# Patient Record
Sex: Male | Born: 1960 | Race: Black or African American | Hispanic: No | Smoking: Never smoker
Health system: Southern US, Community
[De-identification: ages and names within clinical notes are randomized; demographics above are authoritative.]

## PROBLEM LIST (undated history)

## (undated) HISTORY — PX: SHOULDER ARTHROSCOPY W/ ROTATOR CUFF REPAIR: SHX2400

---

## 1998-09-29 ENCOUNTER — Emergency Department (HOSPITAL_COMMUNITY): Admission: EM | Admit: 1998-09-29 | Discharge: 1998-09-29 | Payer: Self-pay | Admitting: Emergency Medicine

## 2003-03-10 ENCOUNTER — Ambulatory Visit (HOSPITAL_BASED_OUTPATIENT_CLINIC_OR_DEPARTMENT_OTHER): Admission: RE | Admit: 2003-03-10 | Discharge: 2003-03-10 | Payer: Self-pay | Admitting: Internal Medicine

## 2003-07-11 ENCOUNTER — Encounter: Payer: Self-pay | Admitting: *Deleted

## 2003-07-11 ENCOUNTER — Emergency Department (HOSPITAL_COMMUNITY): Admission: EM | Admit: 2003-07-11 | Discharge: 2003-07-11 | Payer: Self-pay | Admitting: Emergency Medicine

## 2003-08-03 ENCOUNTER — Encounter: Admission: RE | Admit: 2003-08-03 | Discharge: 2003-08-22 | Payer: Self-pay | Admitting: *Deleted

## 2005-11-10 ENCOUNTER — Ambulatory Visit: Payer: Self-pay | Admitting: Internal Medicine

## 2005-12-03 ENCOUNTER — Ambulatory Visit (HOSPITAL_BASED_OUTPATIENT_CLINIC_OR_DEPARTMENT_OTHER): Admission: RE | Admit: 2005-12-03 | Discharge: 2005-12-03 | Payer: Self-pay | Admitting: Internal Medicine

## 2005-12-17 ENCOUNTER — Ambulatory Visit: Payer: Self-pay | Admitting: Pulmonary Disease

## 2006-01-01 ENCOUNTER — Ambulatory Visit: Payer: Self-pay | Admitting: Internal Medicine

## 2006-03-01 ENCOUNTER — Ambulatory Visit: Payer: Self-pay | Admitting: Internal Medicine

## 2006-08-02 ENCOUNTER — Ambulatory Visit: Payer: Self-pay | Admitting: Internal Medicine

## 2006-08-11 ENCOUNTER — Ambulatory Visit: Payer: Self-pay | Admitting: Cardiology

## 2006-08-25 ENCOUNTER — Ambulatory Visit: Payer: Self-pay | Admitting: Pulmonary Disease

## 2006-09-01 ENCOUNTER — Ambulatory Visit: Payer: Self-pay | Admitting: Pulmonary Disease

## 2006-09-01 ENCOUNTER — Ambulatory Visit (HOSPITAL_BASED_OUTPATIENT_CLINIC_OR_DEPARTMENT_OTHER): Admission: RE | Admit: 2006-09-01 | Discharge: 2006-09-01 | Payer: Self-pay | Admitting: Pulmonary Disease

## 2006-10-06 ENCOUNTER — Ambulatory Visit: Payer: Self-pay | Admitting: Pulmonary Disease

## 2007-03-07 ENCOUNTER — Emergency Department (HOSPITAL_COMMUNITY): Admission: EM | Admit: 2007-03-07 | Discharge: 2007-03-07 | Payer: Self-pay | Admitting: Emergency Medicine

## 2008-03-14 ENCOUNTER — Encounter: Payer: Self-pay | Admitting: Endocrinology

## 2008-06-20 ENCOUNTER — Telehealth (INDEPENDENT_AMBULATORY_CARE_PROVIDER_SITE_OTHER): Payer: Self-pay | Admitting: *Deleted
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
## 2011-05-08 NOTE — Procedures (Signed)
NAME:  [name] [name], [name] [name] NO.:  [id number]   MEDICAL RECORD NO.:  [id number]          PATIENT TYPE:  OUT   LOCATION:  SLEEP CENTER                 FACILITY[facility]   PHYSICIAN:  Marcelyn Bruins, M.D. [facility] DATE OF BIRTH:  28-Nov-1961   DATE OF STUDY:  12/03/2005                              NOCTURNAL POLYSOMNOGRAM   REFERRING PHYSICIAN:  Dr. Trinna Post Plotnikov.   DATE OF STUDY:  December 03, 2005.   INDICATIONS FOR THE STUDY:  Hypersomnia with sleep apnea.   EPWORTH SCORE:  16.   SLEEP ARCHITECTURE:  The patient had a total sleep time of 362 minutes with  decreased REM but adequate slow wave sleep. Sleep onset latency was normal  as was REM onset. Sleep efficiency was decreased at 84%.   RESPIRATORY DATA:  The patient was found to have 146 apneas and 110  hypopneas for a respiratory disturbance index of 43 events per hour. The  events were worse during REM. There was severe snoring noted.   OXYGEN DATA:  There was O2 desaturation as low as 74% associated with his  obstructive events.   CARDIAC DATA:  No clinically significant cardiac arrhythmias.   MOVEMENT/PARASOMNIA:  None.   IMPRESSION/RECOMMENDATION:  Severe obstructive sleep apnea/hypopnea syndrome  with a Respiratory Disturbance Index of 43 events per hour and O2  desaturation as low as 74%. Treatment for this degree of sleep apnea  includes weight loss as well as continuous positive airway pressure. Other  therapies can be considered but their success rate is much lower than weight  loss and continuous positive airway pressure combined.           ______________________________  Marcelyn Bruins, M.D. [facility]  Diplomate, American Board of Sleep  Medicine     KC/MEDQ  D:  12/17/2005 17:22:19  T:  12/17/2005 21:41:36  Job:  [id number]

## 2011-05-08 NOTE — Assessment & Plan Note (Signed)
[facility]                               PULMONARY OFFICE NOTE   NAME:[name] [name], [name] DIOP                        MRN:          [id number]  DATE:10/06/2006                            DOB:          Nov 25, 1961    I saw [name] [name] [name] in followup today for his severe obstructive sleep apnea.  He is currently on CPAP at 14 cm of water with nasal pillows and humidifier,  although he apparently was instructed not to use the heater on his  humidifier until the weather gets colder.  He says he tried using a full  face mask, but feels more comfortable with the nasal pillows.  He is also  using a chin strap.  He says his main difficulty is that he still gets  congestion in his sinuses with postnasal drip.  He also says that he will  occasionally get a tingling feeling in his nose from the nasal pillows, but  then as the day progresses the symptom seems to improve.  He had tried using  Veramyst nasal spray, but it did not seem that this made any difference.  He  is currently using a prescription antihistamine which helps to some degree.  His current sleep pattern is that he will go to bed between 8:30 and 9, and  wake up at about 1:30 in the morning.  He gets up out of bed at 2.  He says  that he is using his CPAP machine for the entire time that he is asleep on a  nightly basis.  He says that he has noticed that he feels better during the  day, to the point where he only needs to use his Adderall about once or  twice per week.  He is still using Adderall 20 mg, however.  He says that he  has also noticed that his headache symptoms have seemed to have improved as  well.   PHYSICAL EXAMINATION:  He is 234 pounds, temperature is 98, blood pressure  is 110/64, heart rate is 65, oxygen saturation is 95% on room air.  HEENT:  There is no sinus tenderness, no nasal discharge, no oral lesions,  no lymphadenopathy.  HEART:  With S1, S2.  CHEST:  Clear to auscultation.  ABDOMEN:  Soft, nontender, positive bowel sounds.   IMPRESSION:  1. Severe obstructive sleep apnea, currently on continuous positive airway      pressure at 14 cm of water.  I would continue him on this.  I have      encouraged him to maintain his compliance with this.  I have also      advised him that if he does in fact end up taking naps during the day,      that he would need to use his continuous positive airway pressure      machine during these naps as well.  Additionally, I have reemphasized      the importance of diet, exercise and weight reduction.  2. Insufficient sleep time.  I have again emphasized the importance of  increasing the number of hours he dedicates to sleep to improve his      overall functionality during the day, and that hopefully with this we      would be able to discontinue the use of the Adderall.  However, if he      is still having symptoms of daytime hypersomnolence in spite of      improving these measures, then consideration could be given to      switching him from Adderall to Provigil, as this has a somewhat better      safety profile.  3. Chronic sinusitis.  I will try him on Astelin 2 sprays in each nostril      b.i.d. to see if this helps with his nasal symptoms, and I have given      him a sample of this.  4. Chronic headaches.  This appears to have improved with the improvement      in his sleep quality.   PLAN:  To follow up with him in approximately 4 months.       Coralyn Helling, MD      VS/MedQ  DD:  10/06/2006  DT:  10/08/2006  Job #:  [id number]   cc:   Georgina Quint. Plotnikov, MD

## 2011-08-15 ENCOUNTER — Emergency Department (HOSPITAL_COMMUNITY)
Admission: EM | Admit: 2011-08-15 | Discharge: 2011-08-16 | Disposition: A | Discharge status: Home / Self Care | Payer: Self-pay | Attending: Emergency Medicine | Admitting: Emergency Medicine

## 2011-08-15 ENCOUNTER — Emergency Department (HOSPITAL_COMMUNITY): Payer: Self-pay

## 2011-08-15 DIAGNOSIS — J039 Acute tonsillitis, unspecified: Secondary | ICD-10-CM | POA: Insufficient documentation

## 2011-08-15 DIAGNOSIS — R1310 Dysphagia, unspecified: Secondary | ICD-10-CM | POA: Insufficient documentation

## 2011-08-15 DIAGNOSIS — R110 Nausea: Secondary | ICD-10-CM | POA: Insufficient documentation

## 2011-08-15 DIAGNOSIS — R059 Cough, unspecified: Secondary | ICD-10-CM | POA: Insufficient documentation

## 2011-08-15 DIAGNOSIS — R002 Palpitations: Secondary | ICD-10-CM | POA: Insufficient documentation

## 2011-08-15 DIAGNOSIS — G4730 Sleep apnea, unspecified: Secondary | ICD-10-CM | POA: Insufficient documentation

## 2011-08-15 DIAGNOSIS — R05 Cough: Secondary | ICD-10-CM | POA: Insufficient documentation

## 2011-08-15 DIAGNOSIS — R599 Enlarged lymph nodes, unspecified: Secondary | ICD-10-CM | POA: Insufficient documentation

## 2011-08-15 LAB — CBC
Platelets: 154 10*3/uL (ref 150–400)
RBC: 4.1 MIL/uL — ABNORMAL LOW (ref 4.22–5.81)
RDW: 13.7 % (ref 11.5–15.5)
WBC: 6 10*3/uL (ref 4.0–10.5)

## 2011-08-15 LAB — DIFFERENTIAL
Basophils Absolute: 0 10*3/uL (ref 0.0–0.1)
Eosinophils Absolute: 0 10*3/uL (ref 0.0–0.7)
Eosinophils Relative: 0 % (ref 0–5)
Lymphs Abs: 1.7 10*3/uL (ref 0.7–4.0)
Neutrophils Relative %: 65 % (ref 43–77)

## 2011-08-15 LAB — POCT I-STAT, CHEM 8
Calcium, Ion: 1.13 mmol/L (ref 1.12–1.32)
Chloride: 101 mEq/L (ref 96–112)
Glucose, Bld: 97 mg/dL (ref 70–99)
HCT: 38 % — ABNORMAL LOW (ref 39.0–52.0)
Hemoglobin: 12.9 g/dL — ABNORMAL LOW (ref 13.0–17.0)
Potassium: 4 mEq/L (ref 3.5–5.1)

## 2011-08-15 MED ORDER — IOHEXOL 300 MG/ML  SOLN
75.0000 mL | Freq: Once | INTRAMUSCULAR | Status: AC | PRN
  Administered 2011-08-15: 75 mL via INTRAVENOUS

## 2012-04-14 ENCOUNTER — Emergency Department (HOSPITAL_COMMUNITY): Payer: Self-pay

## 2012-04-14 ENCOUNTER — Emergency Department (HOSPITAL_COMMUNITY)
Admission: EM | Admit: 2012-04-14 | Discharge: 2012-04-14 | Disposition: A | Discharge status: Home / Self Care | Payer: No Typology Code available for payment source | Attending: Emergency Medicine | Admitting: Emergency Medicine

## 2012-04-14 ENCOUNTER — Encounter (HOSPITAL_COMMUNITY): Payer: Self-pay | Admitting: *Deleted

## 2012-04-14 DIAGNOSIS — IMO0002 Reserved for concepts with insufficient information to code with codable children: Secondary | ICD-10-CM | POA: Insufficient documentation

## 2012-04-14 DIAGNOSIS — M545 Low back pain, unspecified: Secondary | ICD-10-CM | POA: Insufficient documentation

## 2012-04-14 DIAGNOSIS — S46919A Strain of unspecified muscle, fascia and tendon at shoulder and upper arm level, unspecified arm, initial encounter: Secondary | ICD-10-CM

## 2012-04-14 DIAGNOSIS — S39012A Strain of muscle, fascia and tendon of lower back, initial encounter: Secondary | ICD-10-CM

## 2012-04-14 DIAGNOSIS — S0181XA Laceration without foreign body of other part of head, initial encounter: Secondary | ICD-10-CM

## 2012-04-14 DIAGNOSIS — M542 Cervicalgia: Secondary | ICD-10-CM | POA: Insufficient documentation

## 2012-04-14 DIAGNOSIS — R51 Headache: Secondary | ICD-10-CM | POA: Insufficient documentation

## 2012-04-14 DIAGNOSIS — S0180XA Unspecified open wound of other part of head, initial encounter: Secondary | ICD-10-CM | POA: Insufficient documentation

## 2012-04-14 DIAGNOSIS — M25519 Pain in unspecified shoulder: Secondary | ICD-10-CM | POA: Insufficient documentation

## 2012-04-14 MED ORDER — TETANUS-DIPHTH-ACELL PERTUSSIS 5-2.5-18.5 LF-MCG/0.5 IM SUSP
0.5000 mL | Freq: Once | INTRAMUSCULAR | Status: AC
  Administered 2012-04-14: 0.5 mL via INTRAMUSCULAR
  Filled 2012-04-14: qty 0.5

## 2012-04-14 MED ORDER — IBUPROFEN 600 MG PO TABS: 600.0000 mg | ORAL_TABLET | Freq: Four times a day (QID) | ORAL | Status: AC | PRN

## 2012-04-14 MED ORDER — ACETAMINOPHEN 325 MG PO TABS
650.0000 mg | ORAL_TABLET | Freq: Once | ORAL | Status: AC
  Administered 2012-04-14: 650 mg via ORAL
  Filled 2012-04-14: qty 2

## 2012-04-14 NOTE — ED Notes (Signed)
Patient transported to CT 
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
## 2012-04-14 NOTE — ED Notes (Signed)
Returned from radiology. C-collar removed by ED MD. NAD.

## 2012-04-14 NOTE — Discharge Instructions (Signed)
Use antibiotic cream to forehead twice daily.   RESOURCE GUIDE   No Primary Care Doctor Call [facility]  [phone] Other agencies that provide inexpensive medical care    [facility]  [phone]    [facility]  [phone]    [facility]  [phone]    [facility]  [phone]    [facility]  [phone]    [facility]  [phone]

## 2012-04-14 NOTE — ED Notes (Signed)
Patient transported to X-ray 

## 2012-04-14 NOTE — ED Notes (Signed)
Pt logrolled, LSB removed per ED MD, C-collar remains.

## 2012-04-16 NOTE — ED Provider Notes (Signed)
I saw and evaluated the patient, reviewed the resident's note and I agree with the findings and plan.   .Face to face Exam:  General:  Awake HEENT:  laceration Resp:  Normal effort Abd:  Nondistended Neuro:No focal weakness Lymph: No adenopathy   Nelia Shi, MD 04/16/12 [phone]

## 2013-06-14 ENCOUNTER — Telehealth: Payer: Self-pay

## 2013-07-05 NOTE — Telephone Encounter (Signed)
error 

## 2015-01-08 ENCOUNTER — Encounter (HOSPITAL_COMMUNITY): Payer: Self-pay | Admitting: Neurology

## 2015-01-08 ENCOUNTER — Emergency Department (HOSPITAL_COMMUNITY)
Admission: EM | Admit: 2015-01-08 | Discharge: 2015-01-08 | Disposition: A | Discharge status: Home / Self Care | Payer: Medicaid Other | Attending: Emergency Medicine | Admitting: Emergency Medicine

## 2015-01-08 ENCOUNTER — Emergency Department (HOSPITAL_COMMUNITY): Payer: Medicaid Other

## 2015-01-08 DIAGNOSIS — J0100 Acute maxillary sinusitis, unspecified: Secondary | ICD-10-CM | POA: Diagnosis not present

## 2015-01-08 DIAGNOSIS — R059 Cough, unspecified: Secondary | ICD-10-CM

## 2015-01-08 DIAGNOSIS — R05 Cough: Secondary | ICD-10-CM | POA: Diagnosis present

## 2015-01-08 DIAGNOSIS — Z79899 Other long term (current) drug therapy: Secondary | ICD-10-CM | POA: Diagnosis not present

## 2015-01-08 MED ORDER — SULFAMETHOXAZOLE-TRIMETHOPRIM 800-160 MG PO TABS: 1.0000 | ORAL_TABLET | Freq: Two times a day (BID) | ORAL | Status: DC

## 2015-01-08 MED ORDER — DIPHENHYDRAMINE HCL 25 MG PO TABS: 25.0000 mg | ORAL_TABLET | Freq: Four times a day (QID) | ORAL | Status: AC | PRN

## 2015-01-08 NOTE — ED Notes (Signed)
Pt reports cough for 2 weeks, coughing up mucus. C/o of congestion.

## 2015-01-08 NOTE — ED Notes (Signed)
Pt reports that he has been coughing for 2 weeks. Also reports nasal congestion and drainage.

## 2015-01-08 NOTE — Discharge Instructions (Signed)
Take bactrim as directed until gone. Take benadryl as needed for congestion. Refer to attached documents for more information. Continue taking Robitussin at home.

## 2015-01-08 NOTE — ED Provider Notes (Signed)
CSN: [id number]     Arrival date & time 01/08/15  4696 History  This chart was scribed for non-physician practitioner, Emilia Beck, PA-C, working with Toy Baker, MD by Charline Bills, ED Scribe. This patient was seen in room TR07C/TR07C and the patient's care was started at 10:17 AM.   Chief Complaint  Patient presents with  . Cough   The history is provided by the patient. No language interpreter was used.   HPI Comments: [name] [name] is a 54 y.o. male who presents to the Emergency Department complaining of persistent dry cough for 2 weeks, worse today. Pt reports associated nasal congestion, postnasal drip and a globulus sensation. He denies fever. Pt has been treating with Robitussin without relief.  History reviewed. No pertinent past medical history. History reviewed. No pertinent past surgical history. No family history on file. History  Substance Use Topics  . Smoking status: Never Smoker   . Smokeless tobacco: Not on file  . Alcohol Use: No    Review of Systems  HENT: Positive for congestion and postnasal drip.   Respiratory: Positive for cough.   All other systems reviewed and are negative.  Allergies  Review of patient's allergies indicates no known allergies.  Home Medications   Prior to Admission medications   Medication Sig Start Date End Date Taking? Authorizing Provider  Fexofenadine HCl (ALLEGRA PO) Take 1 tablet by mouth daily as needed. For allergies.    Historical Provider, MD   Triage Vitals: BP 126/67 mmHg  Pulse 70  Temp(Src) 97.8 F (36.6 C) (Oral)  Resp 22  SpO2 100% Physical Exam  Constitutional: He is oriented to person, place, and time. He appears well-developed and well-nourished. No distress.  HENT:  Head: Normocephalic and atraumatic.  Nose: Right sinus exhibits maxillary sinus tenderness. Left sinus exhibits maxillary sinus tenderness.  Mouth/Throat: Oropharynx is clear and moist. No oropharyngeal exudate, posterior  oropharyngeal edema or posterior oropharyngeal erythema.  Cobblestone noted of the posterior pharynx.   Eyes: Conjunctivae and EOM are normal.  Neck: Normal range of motion. Neck supple.  Cardiovascular: Normal rate, regular rhythm and normal heart sounds.   Pulmonary/Chest: Effort normal and breath sounds normal.  Abdominal: Soft. There is no tenderness. There is no rebound.  Musculoskeletal: Normal range of motion.  Neurological: He is alert and oriented to person, place, and time.  Skin: Skin is warm and dry.  Psychiatric: He has a normal mood and affect. His behavior is normal.  Nursing note and vitals reviewed.  ED Course  Procedures (including critical care time) DIAGNOSTIC STUDIES: Oxygen Saturation is 100% on RA, normal by my interpretation.    COORDINATION OF CARE: 10:19 AM-Discussed treatment plan which includes CXR, Septra DS and Benadryl with pt at bedside and pt agreed to plan.   Labs Review Labs Reviewed - No data to display  Imaging Review Dg Chest 2 View  01/08/2015   CLINICAL DATA:  Cough and short of breath.  EXAM: CHEST  2 VIEW  COMPARISON:  None.  FINDINGS: The heart size and mediastinal contours are within normal limits. Both lungs are clear. The visualized skeletal structures are unremarkable.  IMPRESSION: No active cardiopulmonary disease.   Electronically Signed   By: Marlan Palau M.D.   On: 01/08/2015 09:51    EKG Interpretation None      MDM   Final diagnoses:  Cough  Acute maxillary sinusitis, recurrence not specified    10:22 AM Chest xray unremarkable for acute changes. Patient will have  bactrim for sinusitis and benadryl for congestion. Vitals stable and patient afebrile.   I personally performed the services described in this documentation, which was scribed in my presence. The recorded information has been reviewed and is accurate.    Emilia BeckKaitlyn Jaasiel [place], PA-C 01/08/15 1023  Toy BakerAnthony T Castanon, MD 01/09/15 1005

## 2016-01-09 ENCOUNTER — Encounter: Payer: Self-pay | Admitting: Pulmonary Disease

## 2016-01-09 ENCOUNTER — Encounter: Payer: Self-pay | Admitting: Internal Medicine

## 2016-11-17 ENCOUNTER — Other Ambulatory Visit: Payer: Self-pay | Admitting: Orthopedic Surgery

## 2016-11-25 ENCOUNTER — Encounter (HOSPITAL_BASED_OUTPATIENT_CLINIC_OR_DEPARTMENT_OTHER): Payer: Self-pay

## 2016-11-25 ENCOUNTER — Ambulatory Visit (HOSPITAL_BASED_OUTPATIENT_CLINIC_OR_DEPARTMENT_OTHER): Admit: 2016-11-25 | Payer: No Typology Code available for payment source | Admitting: Orthopedic Surgery

## 2016-11-25 SURGERY — ARTHROSCOPY, SHOULDER
Anesthesia: General | Laterality: Left

## 2020-02-08 ENCOUNTER — Emergency Department (HOSPITAL_COMMUNITY): Payer: 59

## 2020-02-08 ENCOUNTER — Emergency Department (HOSPITAL_COMMUNITY)
Admission: EM | Admit: 2020-02-08 | Discharge: 2020-02-08 | Disposition: A | Discharge status: Home / Self Care | Payer: 59 | Attending: Emergency Medicine | Admitting: Emergency Medicine

## 2020-02-08 ENCOUNTER — Encounter (HOSPITAL_COMMUNITY): Payer: Self-pay

## 2020-02-08 ENCOUNTER — Other Ambulatory Visit: Payer: Self-pay

## 2020-02-08 DIAGNOSIS — Y93E9 Activity, other interior property and clothing maintenance: Secondary | ICD-10-CM | POA: Diagnosis not present

## 2020-02-08 DIAGNOSIS — S9032XA Contusion of left foot, initial encounter: Secondary | ICD-10-CM | POA: Insufficient documentation

## 2020-02-08 DIAGNOSIS — Y92009 Unspecified place in unspecified non-institutional (private) residence as the place of occurrence of the external cause: Secondary | ICD-10-CM | POA: Diagnosis not present

## 2020-02-08 DIAGNOSIS — S99922A Unspecified injury of left foot, initial encounter: Secondary | ICD-10-CM

## 2020-02-08 DIAGNOSIS — Y998 Other external cause status: Secondary | ICD-10-CM | POA: Diagnosis not present

## 2020-02-08 DIAGNOSIS — W208XXA Other cause of strike by thrown, projected or falling object, initial encounter: Secondary | ICD-10-CM | POA: Insufficient documentation

## 2020-02-08 MED ORDER — IBUPROFEN 800 MG PO TABS: 800.0000 mg | ORAL_TABLET | Freq: Three times a day (TID) | ORAL | 0 refills | Status: DC | PRN

## 2020-02-08 NOTE — ED Notes (Signed)
Pt returned from X-ray.  

## 2020-02-08 NOTE — Discharge Instructions (Signed)
Please contact [employer] to determine which family medicine providers in the area accept your insurance so that you may get established for ongoing evaluation and management of your elbow pain.  Please take your ibuprofen, as prescribed.  Do not combine with other NSAIDs.  Please also continue take Tylenol as needed for breakthrough pain.  Use the crutches.  Activity as tolerated.  There are no bony abnormalities visualized on x-ray that might otherwise prevent you from bearing weight.  Please follow-up with Dr. Carola Frost for ongoing evaluation management should your symptoms fail to improve with conservative therapy.  Return to the ED or seek immediate medical attention for any new or worsening symptoms.

## 2020-02-08 NOTE — ED Notes (Signed)
Pt transported to xray 

## 2020-02-08 NOTE — ED Provider Notes (Signed)
[facility] EMERGENCY DEPARTMENT Provider Note   CSN: [id number] Arrival date & time: 02/08/20  1342     History Chief Complaint  Patient presents with  . left foot injury    [name] [name] is a 59 y.o. male with no significant PMH presents to the ED with injury to left foot.  Patient reports that he was painting his interior walls and was collapsing a ladder when it "came down" onto the top of his foot.  He reports 7 out of 10 pain to the dorsum of the foot, particularly over metatarsals 2 through 4.  Patient also notes that a couple days ago he was digging a trench and had minor discomfort to arch of his foot after "kicking it down" into the dirt.  However, he reports that alone would not have prompted him to come to the ED for evaluation.  He has not been able to ambulate/bear weight on the affected limb and is also endorsing diminished sensation.  He denies any ankle injury, knee injury, hip injury, wound, falls, fevers or chills, or other symptoms.  Patient works in [employer].  HPI     History reviewed. No pertinent past medical history.  There are no problems to display for this patient.   Past Surgical History:  Procedure Laterality Date  . SHOULDER ARTHROSCOPY W/ ROTATOR CUFF REPAIR Left        No family history on file.  Social History   Tobacco Use  . Smoking status: Never Smoker  . Smokeless tobacco: Never Used  Substance Use Topics  . Alcohol use: No  . Drug use: No    Home Medications Prior to Admission medications   Medication Sig Start Date End Date Taking? Authorizing Provider  diphenhydrAMINE (BENADRYL) 25 MG tablet Take 1 tablet (25 mg total) by mouth every 6 (six) hours as needed for itching or allergies (Rash). 01/08/15   Emilia Beck, PA-C  Fexofenadine HCl (ALLEGRA PO) Take 1 tablet by mouth daily as needed. For allergies.    [provider]  ibuprofen (ADVIL) 800 MG tablet Take 1 tablet (800 mg total) by mouth  every 8 (eight) hours as needed for moderate pain. 02/08/20   Lorelee New, PA-C  sulfamethoxazole-trimethoprim (SEPTRA DS) 800-160 MG per tablet Take 1 tablet by mouth 2 (two) times daily. 01/08/15   Emilia Beck, PA-C    Allergies    Patient has no known allergies.  Review of Systems   Review of Systems  Constitutional: Negative for fever.  Musculoskeletal: Positive for gait problem.  Skin: Positive for color change.  Neurological: Positive for numbness. Negative for weakness.    Physical Exam Updated Vital Signs BP 132/86   Pulse 70   Temp 98.2 F (36.8 C) (Oral)   Resp 16   Ht 6\' 1"  (1.854 m)   Wt 106.6 kg   SpO2 100%   BMI 31.00 kg/m   Physical Exam Vitals and nursing note reviewed. Exam conducted with a chaperone present.  Constitutional:      General: He is not in acute distress.    Appearance: Normal appearance. He is not ill-appearing.  HENT:     Head: Normocephalic and atraumatic.  Eyes:     General: No scleral icterus.    Conjunctiva/sclera: Conjunctivae normal.  Cardiovascular:     Rate and Rhythm: Normal rate and regular rhythm.     Pulses: Normal pulses.     Heart sounds: Normal heart sounds.  Pulmonary:  Effort: Pulmonary effort is normal.     Breath sounds: Normal breath sounds.  Musculoskeletal:     Comments: Left foot: Mild swelling and TTP over distal aspect of second, third, and fourth metatarsals.  Mild ecchymoses and evidence of blood blister.  No open wound.  Pedal pulse and cap refill intact.  Soft compartments.  Patient able to wiggle toes.  Sensation intact throughout, albeit mildly diminished on bottom when compared to right.  Strength intact throughout. Left ankle: Normal. Left knee: Normal. Left hip: Normal.  Skin:    General: Skin is dry.     Capillary Refill: Capillary refill takes less than 2 seconds.  Neurological:     Mental Status: He is alert and oriented to person, place, and time.     GCS: GCS eye subscore is 4.  GCS verbal subscore is 5. GCS motor subscore is 6.  Psychiatric:        Mood and Affect: Mood normal.        Behavior: Behavior normal.        Thought Content: Thought content normal.     ED Results / Procedures / Treatments   Labs (all labs ordered are listed, but only abnormal results are displayed) Labs Reviewed - No data to display  EKG None  Radiology DG Foot Complete Left  Result Date: 02/08/2020 CLINICAL DATA:  Dropped ladder on foot, pain EXAM: LEFT FOOT - COMPLETE 3+ VIEW COMPARISON:  None. FINDINGS: No fracture or dislocation of the left foot. Joint spaces are well preserved. Soft tissue edema about the dorsal forefoot. IMPRESSION: No fracture or dislocation of the left foot. Electronically Signed   By: Eddie Candle M.D.   On: 02/08/2020 14:18    Procedures Procedures (including critical care time)  Medications Ordered in ED Medications - No data to display  ED Course  I have reviewed the triage vital signs and the nursing notes.  Pertinent labs & imaging results that were available during my care of the patient were reviewed by me and considered in my medical decision making (see chart for details).    MDM Rules/Calculators/A&P                      DG left foot complete demonstrates no fracture, dislocation, or other acute bony abnormalities.  While patient is endorsing "diminished sensation", sensation is still grossly intact throughout.  Pedal pulses and cap refill also intact and patient is able to wiggle his toes without difficulty.  Do not suspect any impeded or diminished blood flow.  While neurovascular injury is a possibility, I suspect that his pins and needles sensation is attributable to his swelling and injury.  On reevaluation, patient is reporting that he can ambulate, however it is much more comfortable with slippers rather than walking barefoot.  His pins-and-needles are predominantly on the bottom of his foot, suspect that his injury while kicking  down on an injury may have precipitated inflammation to plantar fascia.  Suspect that it will resolve with anti-inflammatories and RICE therapy.  Will prescribe crutches.  Patient denied post-op boot when offered.  Patient has [employer] but states that he has not yet established with a primary care provider.  Emphasized the importance of finding which providers in the area accept his insurance and scheduling appointment for evaluation.  His most recent lab work is from 2012.    We will refer to orthopedics for ongoing evaluation management should his symptoms fail to improve with conservative  therapy.  Strict ED return precautions discussed.  All of the evaluation and work-up results were discussed with the patient and any family at bedside. They were provided opportunity to ask any additional questions and have none at this time. They have expressed understanding of verbal discharge instructions as well as return precautions and are agreeable to the plan.    Final Clinical Impression(s) / ED Diagnoses Final diagnoses:  Foot injury, left, initial encounter    Rx / DC Orders ED Discharge Orders         Ordered    ibuprofen (ADVIL) 800 MG tablet  Every 8 hours PRN     02/08/20 1455           Elvera Maria 02/08/20 1455    Sabas Sous, MD 02/11/20 1559

## 2020-02-08 NOTE — ED Triage Notes (Signed)
Pt arrived via POV for c/o left foot injury that happened last night when a metal ladder slammed on the anterior of left foot. Also on Tuesday pt jumped on top of a shovel to dig a trench and his foot slipped on shovel and the shovel pressed into left inner foot very hard. Pt states left foot feels numb since the injury last night. Pt has 2+ edema of left foot, warm to touch, pt able to wiggle toes, 2+ left pedal pulse.

## 2020-02-29 ENCOUNTER — Other Ambulatory Visit: Payer: Self-pay

## 2020-02-29 ENCOUNTER — Emergency Department (HOSPITAL_COMMUNITY)
Admission: EM | Admit: 2020-02-29 | Discharge: 2020-02-29 | Disposition: A | Discharge status: Home / Self Care | Payer: 59 | Attending: Emergency Medicine | Admitting: Emergency Medicine

## 2020-02-29 ENCOUNTER — Encounter (HOSPITAL_COMMUNITY): Payer: Self-pay

## 2020-02-29 DIAGNOSIS — X500XXA Overexertion from strenuous movement or load, initial encounter: Secondary | ICD-10-CM | POA: Insufficient documentation

## 2020-02-29 DIAGNOSIS — M5441 Lumbago with sciatica, right side: Secondary | ICD-10-CM | POA: Insufficient documentation

## 2020-02-29 DIAGNOSIS — M545 Low back pain: Secondary | ICD-10-CM | POA: Diagnosis present

## 2020-02-29 MED ORDER — CYCLOBENZAPRINE HCL 10 MG PO TABS: 10.0000 mg | ORAL_TABLET | Freq: Two times a day (BID) | ORAL | 0 refills | Status: AC | PRN

## 2020-02-29 MED ORDER — HYDROCODONE-ACETAMINOPHEN 5-325 MG PO TABS
2.0000 | ORAL_TABLET | Freq: Once | ORAL | Status: AC
  Administered 2020-02-29: 23:00:00 2 via ORAL
  Filled 2020-02-29: qty 2

## 2020-02-29 MED ORDER — PREDNISONE 20 MG PO TABS
60.0000 mg | ORAL_TABLET | Freq: Once | ORAL | Status: AC
  Administered 2020-02-29: 60 mg via ORAL
  Filled 2020-02-29: qty 3

## 2020-02-29 MED ORDER — PREDNISONE 20 MG PO TABS: 40.0000 mg | ORAL_TABLET | Freq: Every day | ORAL | 0 refills | Status: DC

## 2020-02-29 MED ORDER — KETOROLAC TROMETHAMINE 30 MG/ML IJ SOLN
30.0000 mg | Freq: Once | INTRAMUSCULAR | Status: AC
  Administered 2020-02-29: 30 mg via INTRAMUSCULAR
  Filled 2020-02-29: qty 1

## 2020-02-29 NOTE — ED Provider Notes (Signed)
[place] EMERGENCY DEPARTMENT Provider Note   CSN: [id number] Arrival date & time: 02/29/20  1942     History Chief Complaint  Patient presents with  . Back Pain    [name] [name] is a 59 y.o. male.  Patient presents to the emergency department with a chief complaint of low back pain.  He states that the pain started about 1 week ago and has been gradually worsening.  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  He reports that he is frequently lifting and moving heavy materials.  States that he sustained an injury approximately 4 months ago in which she had to do physical therapy for his back.  He denies ever having seen a back specialist.  Denies any fever or chills.  Denies bowel or bladder incontinence.  Reports that he has had some constipation.  He states that the pain radiates down his right leg.  Pain is worsened with position changes, bending, and twisting.  He states that it is improved when he leans away from the affected side.  The history is provided by the patient. No language interpreter was used.       History reviewed. No pertinent past medical history.  There are no problems to display for this patient.   Past Surgical History:  Procedure Laterality Date  . SHOULDER ARTHROSCOPY W/ ROTATOR CUFF REPAIR Left        History reviewed. No pertinent family history.  Social History   Tobacco Use  . Smoking status: Never Smoker  . Smokeless tobacco: Never Used  Substance Use Topics  . Alcohol use: No  . Drug use: No    Home Medications Prior to Admission medications   Medication Sig Start Date End Date Taking? Authorizing Provider  diphenhydrAMINE (BENADRYL) 25 MG tablet Take 1 tablet (25 mg total) by mouth every 6 (six) hours as needed for itching or allergies (Rash). 01/08/15  Yes Szekalski, Kaitlyn, PA-C  ibuprofen (ADVIL) 800 MG tablet Take 1 tablet (800 mg total) by mouth every 8 (eight) hours as needed for  moderate pain. 02/08/20  Yes Corena Herter, PA-C  cyclobenzaprine (FLEXERIL) 10 MG tablet Take 1 tablet (10 mg total) by mouth 2 (two) times daily as needed for muscle spasms. 02/29/20   Montine Circle, PA-C  predniSONE (DELTASONE) 20 MG tablet Take 2 tablets (40 mg total) by mouth daily. Take 40 mg by mouth daily for 3 days, then 20mg  by mouth daily for 3 days, then 10mg  daily for 3 days 02/29/20   Montine Circle, PA-C  sulfamethoxazole-trimethoprim (SEPTRA DS) 800-160 MG per tablet Take 1 tablet by mouth 2 (two) times daily. Patient not taking: Reported on 02/29/2020 01/08/15   Alvina Chou, PA-C    Allergies    Patient has no known allergies.  Review of Systems   Review of Systems  All other systems reviewed and are negative.   Physical Exam Updated Vital Signs BP 118/76 (BP Location: Right Arm) Comment: Simultaneous filing. User may not have seen previous data.  Pulse 66 Comment: Simultaneous filing. User may not have seen previous data.  Temp 99.3 F (37.4 C) (Oral) Comment: Simultaneous filing. User may not have seen previous data. Comment (Src): Simultaneous filing. User may not have seen previous data.  Resp 18 Comment: Simultaneous filing. User may not have seen previous data.  Ht 6\' 1"  (1.854 m)   Wt 106.5 kg   SpO2 100%   BMI 30.98 kg/m  Physical Exam Physical Exam  Constitutional: Pt appears well-developed and well-nourished. No distress.  HENT:  Head: Normocephalic and atraumatic.  Mouth/Throat: Oropharynx is clear and moist. No oropharyngeal exudate.  Eyes: Conjunctivae are normal.  Neck: Normal range of motion. Neck supple.  No meningismus Cardiovascular: Normal rate, regular rhythm and intact distal pulses.   Pulmonary/Chest: Effort normal and breath sounds normal. No respiratory distress. Pt has no wheezes.  Abdominal: Pt exhibits no distension Musculoskeletal:  Right lumbar paraspinal muscle tenderness, no bony CTLS spine tenderness, deformity,  step-off, or crepitus Lymphadenopathy: Pt has no cervical adenopathy.  Neurological: Pt is alert and oriented Speech is clear and goal oriented, follows commands Normal 5/5 strength in upper and lower extremities bilaterally including dorsiflexion and plantar flexionSensation intact Great toe extension intact Moves extremities without ataxia, coordination intact Antalgic gait Normal balance Skin: Skin is warm and dry. No rash noted. Pt is not diaphoretic. No erythema.  Psychiatric: Pt has a normal mood and affect. Behavior is normal.  Nursing note and vitals reviewed.  ED Results / Procedures / Treatments   Labs (all labs ordered are listed, but only abnormal results are displayed) Labs Reviewed - No data to display  EKG None  Radiology No results found.  Procedures Procedures (including critical care time)  Medications Ordered in ED Medications  ketorolac (TORADOL) 30 MG/ML injection 30 mg (has no administration in time range)  HYDROcodone-acetaminophen (NORCO/VICODIN) 5-325 MG per tablet 2 tablet (has no administration in time range)  predniSONE (DELTASONE) tablet 60 mg (has no administration in time range)    ED Course  I have reviewed the triage vital signs and the nursing notes.  Pertinent labs & imaging results that were available during my care of the patient were reviewed by me and considered in my medical decision making (see chart for details).    MDM Rules/Calculators/A&P                      Patient with back pain.    No neurological deficits and normal neuro exam.  Patient is ambulatory.  No loss of bowel or bladder control.  Doubt cauda equina.  Denies fever,  doubt epidural abscess or other lesion. Recommend back exercises, stretching, RICE, and will treat with a short course of prednisone and flexeril.  Consultants: none   Encouraged the patient that there could be a need for additional workup and/or imaging such as MRI, if the symptoms do not  resolve. Patient advised that if the back pain does not resolve, or radiates, this could progress to more serious conditions and is encouraged to follow-up with PCP or orthopedics within 2 weeks.    Final Clinical Impression(s) / ED Diagnoses Final diagnoses:  Acute right-sided low back pain with right-sided sciatica    Rx / DC Orders ED Discharge Orders         Ordered    predniSONE (DELTASONE) 20 MG tablet  Daily     02/29/20 2215    cyclobenzaprine (FLEXERIL) 10 MG tablet  2 times daily PRN     02/29/20 2215           Roxy Horseman, PA-C 02/29/20 2220    Milagros Loll, MD 03/05/20 0030

## 2020-02-29 NOTE — ED Triage Notes (Signed)
Pt arrives via POV from home w/ c/o of back pain x 1 week. Pt states he injured his back at work approximately 4 months ago and recently re-injured it. Pt states 10/10 lower back pain.

## 2020-03-11 ENCOUNTER — Emergency Department (HOSPITAL_COMMUNITY): Payer: PRIVATE HEALTH INSURANCE

## 2020-03-11 ENCOUNTER — Encounter (HOSPITAL_COMMUNITY): Payer: Self-pay | Admitting: *Deleted

## 2020-03-11 ENCOUNTER — Emergency Department (HOSPITAL_COMMUNITY)
Admission: EM | Admit: 2020-03-11 | Discharge: 2020-03-11 | Disposition: A | Discharge status: Home / Self Care | Payer: PRIVATE HEALTH INSURANCE | Attending: Emergency Medicine | Admitting: Emergency Medicine

## 2020-03-11 ENCOUNTER — Other Ambulatory Visit: Payer: Self-pay

## 2020-03-11 DIAGNOSIS — R262 Difficulty in walking, not elsewhere classified: Secondary | ICD-10-CM | POA: Insufficient documentation

## 2020-03-11 DIAGNOSIS — M5441 Lumbago with sciatica, right side: Secondary | ICD-10-CM

## 2020-03-11 DIAGNOSIS — M545 Low back pain: Secondary | ICD-10-CM | POA: Diagnosis present

## 2020-03-11 MED ORDER — GABAPENTIN 100 MG PO CAPS
100.0000 mg | ORAL_CAPSULE | Freq: Once | ORAL | Status: AC
  Administered 2020-03-11: 100 mg via ORAL
  Filled 2020-03-11: qty 1

## 2020-03-11 MED ORDER — OXYCODONE-ACETAMINOPHEN 5-325 MG PO TABS
1.0000 | ORAL_TABLET | Freq: Once | ORAL | Status: AC
  Administered 2020-03-11: 11:00:00 1 via ORAL
  Filled 2020-03-11: qty 1

## 2020-03-11 MED ORDER — HYDROMORPHONE HCL 1 MG/ML IJ SOLN
1.0000 mg | Freq: Once | INTRAMUSCULAR | Status: AC
  Administered 2020-03-11: 21:00:00 1 mg via INTRAVENOUS
  Filled 2020-03-11: qty 1

## 2020-03-11 MED ORDER — DIAZEPAM 5 MG/ML IJ SOLN
5.0000 mg | Freq: Once | INTRAMUSCULAR | Status: AC
  Administered 2020-03-11: 5 mg via INTRAMUSCULAR
  Filled 2020-03-11: qty 2

## 2020-03-11 MED ORDER — OXYCODONE-ACETAMINOPHEN 7.5-325 MG PO TABS: 1.0000 | ORAL_TABLET | Freq: Four times a day (QID) | ORAL | 0 refills | Status: DC | PRN

## 2020-03-11 MED ORDER — HYDROMORPHONE HCL 1 MG/ML IJ SOLN
1.0000 mg | Freq: Once | INTRAMUSCULAR | Status: AC
  Administered 2020-03-11: 12:00:00 1 mg via INTRAVENOUS
  Filled 2020-03-11: qty 1

## 2020-03-11 MED ORDER — HYDROMORPHONE HCL 1 MG/ML IJ SOLN
0.5000 mg | Freq: Once | INTRAMUSCULAR | Status: AC
  Administered 2020-03-11: 12:00:00 0.5 mg via INTRAVENOUS
  Filled 2020-03-11: qty 1

## 2020-03-11 MED ORDER — KETOROLAC TROMETHAMINE 60 MG/2ML IM SOLN
60.0000 mg | Freq: Once | INTRAMUSCULAR | Status: AC
  Administered 2020-03-11: 11:00:00 60 mg via INTRAMUSCULAR
  Filled 2020-03-11: qty 2

## 2020-03-11 MED ORDER — TIZANIDINE HCL 4 MG PO TABS: 4.0000 mg | ORAL_TABLET | Freq: Three times a day (TID) | ORAL | 0 refills | Status: DC | PRN

## 2020-03-11 MED ORDER — HYDROMORPHONE HCL 1 MG/ML IJ SOLN
0.5000 mg | Freq: Once | INTRAMUSCULAR | Status: AC
  Administered 2020-03-11: 14:00:00 0.5 mg via INTRAVENOUS
  Filled 2020-03-11: qty 1

## 2020-03-11 NOTE — ED Notes (Signed)
Pt to MRI at this time.

## 2020-03-11 NOTE — ED Notes (Signed)
Patient is unable to ambulate in the hallway at this time. , he is resting calmly at present states as long as he doesn't move he don't hurt. . Girlfriend remains at bedside.

## 2020-03-11 NOTE — ED Provider Notes (Signed)
Patient signed out to me by L. Lilyan Punt, PA-C.  Please see previous notes for further history. In brief, patient presenting for evaluation of severe worsening back pain.  He was initially seen/diagnosed with sciatica on March 11.  He has an appointment with neurosurgery tomorrow.  He was at work today when he pushed on clutch, had severe worsening back pain.  He is unable to ambulate due to pain.  He is pending an MRI.  If MRI is normal, plan for discharge with pain medicine and follow-up tomorrow.  MRI shows multilevel degenerative disease with stenosis and disc protrusion.  No sign of cauda equina or neurosurgical emergency.  Discussed findings with patient.  Patient would like to try and go home with pain medication and follow-up in the office tomorrow. At this time, pt appears safe for d/c. Return precautions given. Pt states he understands and agrees to plan.    DG Lumbar Spine Complete  Result Date: 03/11/2020 CLINICAL DATA:  Acute onset of severe right-sided low back pain extending into the right leg. EXAM: LUMBAR SPINE - COMPLETE 4+ VIEW COMPARISON:  Lumbar radiographs dated 03/06/2020 FINDINGS: There is no evidence of lumbar spine fracture. Alignment is normal. Slight narrowing of the L3-4 disc space, unchanged. No facet arthritis. IMPRESSION: No acute abnormality. Slight degenerative disc disease at L3-4. Electronically Signed   By: Lorriane Shire M.D.   On: 03/11/2020 12:10   MR LUMBAR SPINE WO CONTRAST  Result Date: 03/11/2020 CLINICAL DATA:  Initial evaluation for acute low back pain. EXAM: MRI LUMBAR SPINE WITHOUT CONTRAST TECHNIQUE: Multiplanar, multisequence MR imaging of the lumbar spine was performed. No intravenous contrast was administered. COMPARISON:  Prior radiograph from earlier the same day. FINDINGS: Segmentation: Standard. Lowest well-formed disc space labeled the L5-S1 level. Alignment: Physiologic with preservation of the normal lumbar lordosis. No listhesis. Vertebrae:  Vertebral body height maintained without evidence for acute or chronic fracture. Bone marrow signal intensity diffusely heterogeneous but within normal limits. No discrete or worrisome osseous lesions. No abnormal marrow edema. Conus medullaris and cauda equina: Conus extends to the L1 level. Conus and cauda equina appear normal. Paraspinal and other soft tissues: Paraspinous soft tissues within normal limits. Visualized visceral structures are normal. Disc levels: T12-L1: Unremarkable. L1-2:  Unremarkable. L2-3: Small biforaminal disc protrusions closely approximate the exiting L2 nerve roots bilaterally (series 6, image 12). Superimposed mild facet hypertrophy. No significant spinal stenosis. Mild bilateral L2 foraminal narrowing. No frank impingement. L3-4: Chronic intervertebral disc space narrowing with diffuse disc bulge and disc desiccation. Mild reactive endplate changes. Superimposed broad-based left foraminal to extraforaminal disc protrusion contacts the exiting left L3 nerve root as it courses out of the left neural foramen (series 5, image 12). Superimposed mild facet hypertrophy. Resultant mild to moderate canal with bilateral lateral recess stenosis. Spinal stenosis. Moderate bilateral L3 foraminal narrowing, left greater than right. L4-5: Diffuse disc bulge with disc desiccation. Superimposed right foraminal disc protrusion closely approximates the exiting right L4 nerve root (series 6, image 25). Superimposed mild to moderate facet and ligament flavum hypertrophy. Associated trace bilateral joint effusions. Resultant mild canal with moderate right lateral recess stenosis. Moderate right with mild left L4 foraminal narrowing. L5-S1: Negative interspace. Moderate bilateral facet hypertrophy. No canal or lateral recess stenosis. Foramina remain patent. IMPRESSION: 1. No acute abnormality within the lumbar spine. 2. Multifactorial degenerative changes at L3-4 with resultant mild to moderate canal and  bilateral lateral recess stenosis, with moderate bilateral L3 foraminal narrowing. 3. Right foraminal disc protrusion and  facet hypertrophy at L4-5 with resultant moderate right lateral recess and foraminal stenosis. Either the right L4 or L5 nerve roots could be affected. 4. Small biforaminal disc protrusions at L2-3 with resultant mild right worse than left foraminal stenosis. Electronically Signed   By: Rise Mu M.D.   On: 03/11/2020 19:44      Alveria Apley, PA-C 03/11/20 2029    Benjiman Core, MD 03/11/20 2337

## 2020-03-11 NOTE — ED Provider Notes (Signed)
[facility] EMERGENCY DEPARTMENT Provider Note   CSN: [id number] Arrival date & time: 03/11/20  1005     History Chief Complaint  Patient presents with  . Back Pain    [name] [name] is a 59 y.o. male he presents for evaluation of back pain.  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  Patient reports that he has a history of injury a few months ago and states he has been dealing with back pain since then.  He was seen in the ED on 02/29/2020 and was discharged home with prednisone, Flexeril and instructed to use ibuprofen as directed.  No imaging done at that time.  He states that he has had some minor improvement in his symptoms since medication but states that today, while at work, when he went to use the clutch, he started wellbeing severe pain.  He reports that he is having intermittent spasm that goes down into his leg.  No falls, trauma.  He states he is scheduled to have an MRI tomorrow for further evaluation of his pain.  He denies any urinary complaints, abdominal pain, nausea/vomiting. Denies fevers, weight loss, numbness/weakness of upper and lower extremities, bowel/bladder incontinence, saddle anesthesia, history of back surgery, history of IVDA.     The history is provided by the patient.       History reviewed. No pertinent past medical history.  There are no problems to display for this patient.   Past Surgical History:  Procedure Laterality Date  . SHOULDER ARTHROSCOPY W/ ROTATOR CUFF REPAIR Left        No family history on file.  Social History   Tobacco Use  . Smoking status: Never Smoker  . Smokeless tobacco: Never Used  Substance Use Topics  . Alcohol use: No  . Drug use: No    Home Medications Prior to Admission medications   Medication Sig Start Date End Date Taking? Authorizing  Provider  cyclobenzaprine (FLEXERIL) 10 MG tablet Take 1 tablet (10 mg total) by mouth 2 (two) times daily as needed for muscle spasms. 02/29/20   Montine Circle, PA-C  diphenhydrAMINE (BENADRYL) 25 MG tablet Take 1 tablet (25 mg total) by mouth every 6 (six) hours as needed for itching or allergies (Rash). 01/08/15   Szekalski, Kaitlyn, PA-C  ibuprofen (ADVIL) 800 MG tablet Take 1 tablet (800 mg total) by mouth every 8 (eight) hours as needed for moderate pain. 02/08/20   Corena Herter, PA-C  predniSONE (DELTASONE) 20 MG tablet Take 2 tablets (40 mg total) by mouth daily. Take 40 mg by mouth daily for 3 days, then 20mg  by mouth daily for 3 days, then 10mg  daily for 3 days 02/29/20   Montine Circle, PA-C  sulfamethoxazole-trimethoprim (SEPTRA DS) 800-160 MG per tablet Take 1 tablet by mouth 2 (two) times daily. Patient not taking: Reported on 02/29/2020 01/08/15   Alvina Chou, PA-C    Allergies    Patient has no known allergies.  Review of Systems   Review of Systems  Constitutional: Negative for fever.  Gastrointestinal: Negative for abdominal pain, nausea and vomiting.  Genitourinary: Negative for dysuria.  Musculoskeletal: Positive for back pain. Negative for neck pain.  Neurological: Negative for weakness and numbness.  All other systems reviewed and are negative.   Physical Exam Updated Vital Signs BP (!) 141/78 (  BP Location: Left Arm)   Pulse 81   Temp 97.9 F (36.6 C) (Oral)   Resp 18   Ht 6\' 1"  (1.854 m)   Wt 113.9 kg   SpO2 99%   BMI 33.12 kg/m   Physical Exam Vitals and nursing note reviewed.  Constitutional:      Appearance: Normal appearance. He is well-developed.     Comments: Appears uncomfortable  HENT:     Head: Normocephalic and atraumatic.  Eyes:     General: Lids are normal.     Conjunctiva/sclera: Conjunctivae normal.     Pupils: Pupils are equal, round, and reactive to light.  Neck:     Comments: Full flexion/extension and lateral movement  of neck fully intact. No bony midline tenderness. No deformities or crepitus.  Cardiovascular:     Rate and Rhythm: Normal rate and regular rhythm.     Pulses: Normal pulses.          Dorsalis pedis pulses are 2+ on the right side.     Heart sounds: Normal heart sounds. No murmur. No friction rub. No gallop.   Pulmonary:     Effort: Pulmonary effort is normal.     Breath sounds: Normal breath sounds.  Abdominal:     Palpations: Abdomen is soft. Abdomen is not rigid.     Tenderness: There is no abdominal tenderness. There is no guarding.     Comments: Abdomen is soft, non-distended, non-tender. No rigidity, No guarding. No peritoneal signs.  Musculoskeletal:        General: Normal range of motion.     Cervical back: Full passive range of motion without pain.     Comments: No midline T-spine tenderness.  Tenderness palpation midline L-spine that extends into the paraspinal muscles of the lower right back into the gluteal region.  No overlying rash.  Skin:    General: Skin is warm and dry.     Capillary Refill: Capillary refill takes less than 2 seconds.     Comments: Good distal cap refill.  RLE is not dusky in appearance or cool to touch.  Neurological:     Mental Status: He is alert and oriented to person, place, and time.     Comments: Follows commands, Moves all extremities  5/5 strength to BUE and LLE Difficulty lifting right lower extremity secondary to pain.  No weakness. Positive SLR on right. Sensation intact throughout all major nerve distributions  Psychiatric:        Speech: Speech normal.     ED Results / Procedures / Treatments   Labs (all labs ordered are listed, but only abnormal results are displayed) Labs Reviewed - No data to display  EKG None  Radiology DG Lumbar Spine Complete  Result Date: 03/11/2020 CLINICAL DATA:  Acute onset of severe right-sided low back pain extending into the right leg. EXAM: LUMBAR SPINE - COMPLETE 4+ VIEW COMPARISON:  Lumbar  radiographs dated 03/06/2020 FINDINGS: There is no evidence of lumbar spine fracture. Alignment is normal. Slight narrowing of the L3-4 disc space, unchanged. No facet arthritis. IMPRESSION: No acute abnormality. Slight degenerative disc disease at L3-4. Electronically Signed   By: 03/08/2020 M.D.   On: 03/11/2020 12:10    Procedures Procedures (including critical care time)  Medications Ordered in ED Medications  diazepam (VALIUM) injection 5 mg (5 mg Intramuscular Given 03/11/20 1035)  oxyCODONE-acetaminophen (PERCOCET/ROXICET) 5-325 MG per tablet 1 tablet (1 tablet Oral Given 03/11/20 1035)  ketorolac (TORADOL) injection 60 mg (60 mg  Intramuscular Given 03/11/20 1035)  HYDROmorphone (DILAUDID) injection 1 mg (1 mg Intravenous Given 03/11/20 1133)  HYDROmorphone (DILAUDID) injection 0.5 mg (0.5 mg Intravenous Given 03/11/20 1223)  gabapentin (NEURONTIN) capsule 100 mg (100 mg Oral Given 03/11/20 1227)  HYDROmorphone (DILAUDID) injection 0.5 mg (0.5 mg Intravenous Given 03/11/20 1402)    ED Course  I have reviewed the triage vital signs and the nursing notes.  Pertinent labs & imaging results that were available during my care of the patient were reviewed by me and considered in my medical decision making (see chart for details).    MDM Rules/Calculators/A&P                      59 year old male who presents for evaluation of right-sided back pain that radiates into his right lower extremity.  Seen here few weeks ago for evaluation of back pain diagnosis sciatica.  He called neurosurgery and has an appointment them tomorrow.  He is arranging to get an MRI of his lumbar spine tomorrow.  Was at work today and states also that when he hit the clutch, he started having severe back pain.  Unable to ambulate since then secondary to pain.  Denies any numbness of his right lower extremity but states that the pain radiates down his leg.  He has no abdominal pain, urinary symptoms.  Do not suspect  kidney stone.  Additionally, do not suspect dissection.  Suspect this is most likely musculoskeletal in nature versus sciatica.  History/physical exam not concerning for cauda equina, spinal abscess.  Will plan for pain control.  I reviewed his previous work-up here in the ED.  He not have any imaging at this time.  Given that his pain has been continuous, plan for imaging to ensure that there is no acute bony abnormality.  Reevaluation.  Patient still having significant pain and is very uncomfortable.  Will give additional analgesics.  Lumbar spine shows slight degenerative disc disease at L3-L4.  Reevaluation.  Patient still very uncomfortable.  Unable to get up and ambulate secondary to pain.  At this time, he has had Valium, Percocet, Dilaudid x2, gabapentin, Toradol with no improvement in symptoms.  Will plan for MRI for evaluation of acute abnormalities given his continued pain and inability to walk.  Patient signed out to [facility], PA-C MRI pending.  If MRI is unremarkable, plan for pain control with follow-up with neurosurgery tomorrow and discharge home.  Portions of this note were generated with Scientist, clinical (histocompatibility and immunogenetics). Dictation errors may occur despite best attempts at proofreading.   Final Clinical Impression(s) / ED Diagnoses Final diagnoses:  Acute right-sided low back pain with right-sided sciatica    Rx / DC Orders ED Discharge Orders    None       Rosana Hoes 03/11/20 1558    Eber Hong, MD 03/13/20 1000

## 2020-03-11 NOTE — ED Triage Notes (Signed)
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 Severe right sided lower back pain with radiation into right leg. Denies injury. States he recently finished a prednisone pack on Sat. Pm.

## 2020-03-11 NOTE — ED Notes (Signed)
Pt waiting for MRI.  Ice chips given to pt.

## 2020-03-11 NOTE — Discharge Instructions (Signed)
Continue taking her medications as prescribed. Continue taking ibuprofen 800 mg 3 times a day. Use Percocet as needed for severe breakthrough pain. Use Zanaflex to help with muscle relaxer. Follow-up tomorrow at your scheduled appointment with neurosurgery for further evaluation and management of your symptoms. Return to the emergency room if you develop high fevers, loss of bowel bladder control, or any new, worsening, or concerning symptoms.

## 2020-03-11 NOTE — ED Notes (Signed)
Pt returned from MRI °

## 2020-03-11 NOTE — ED Notes (Signed)
Patient transported to X-ray 

## 2021-08-22 IMAGING — CR DG LUMBAR SPINE COMPLETE 4+V
5 series · 5 of 5 positions shown · non-contrast
Comparison: Lumbar radiographs dated 03/06/2020

CLINICAL DATA: Acute onset of severe right-sided low back pain
extending into the right leg.

EXAM:
LUMBAR SPINE - COMPLETE 4+ VIEW

[l-spine ap]
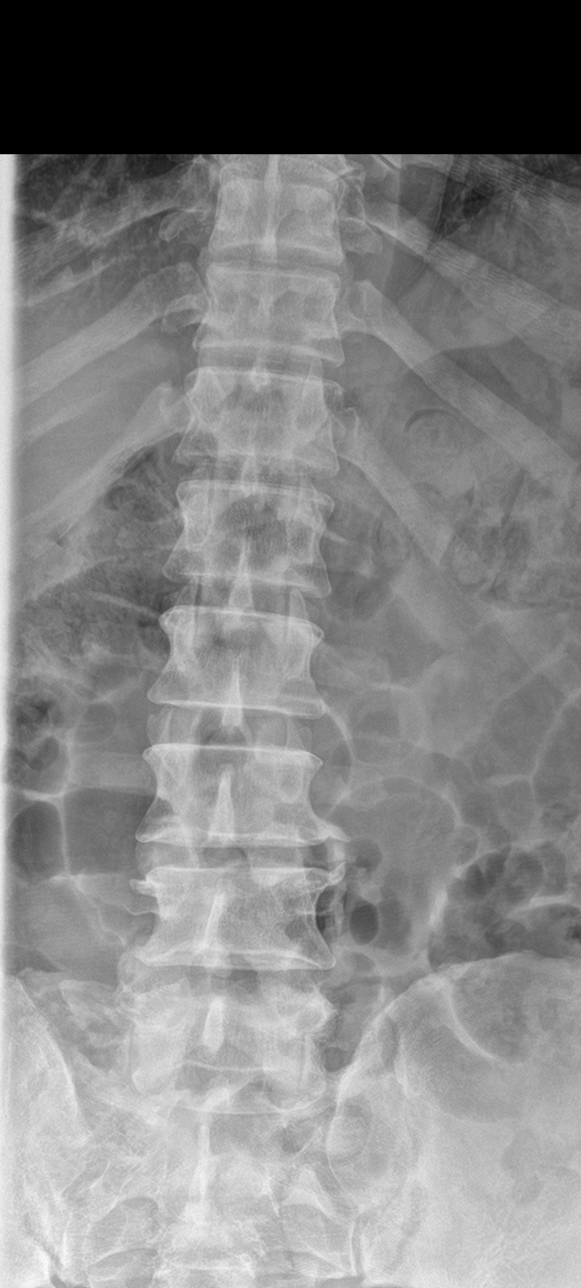

[l-spine obl (1 of 2)]
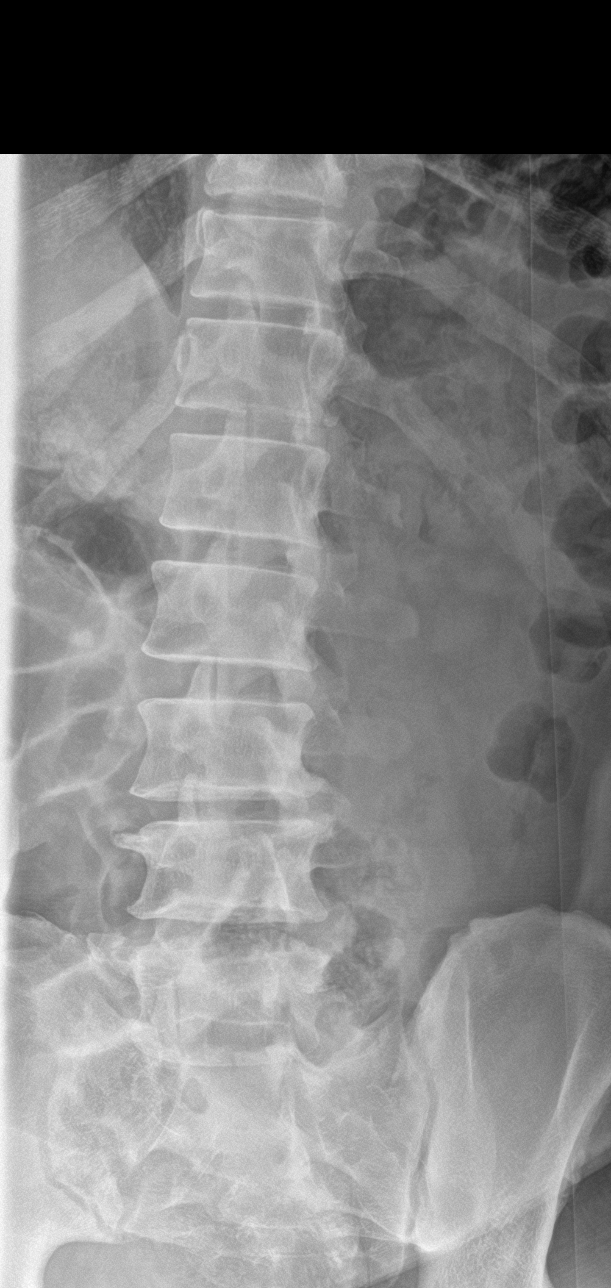

[l-spine obl (2 of 2)]
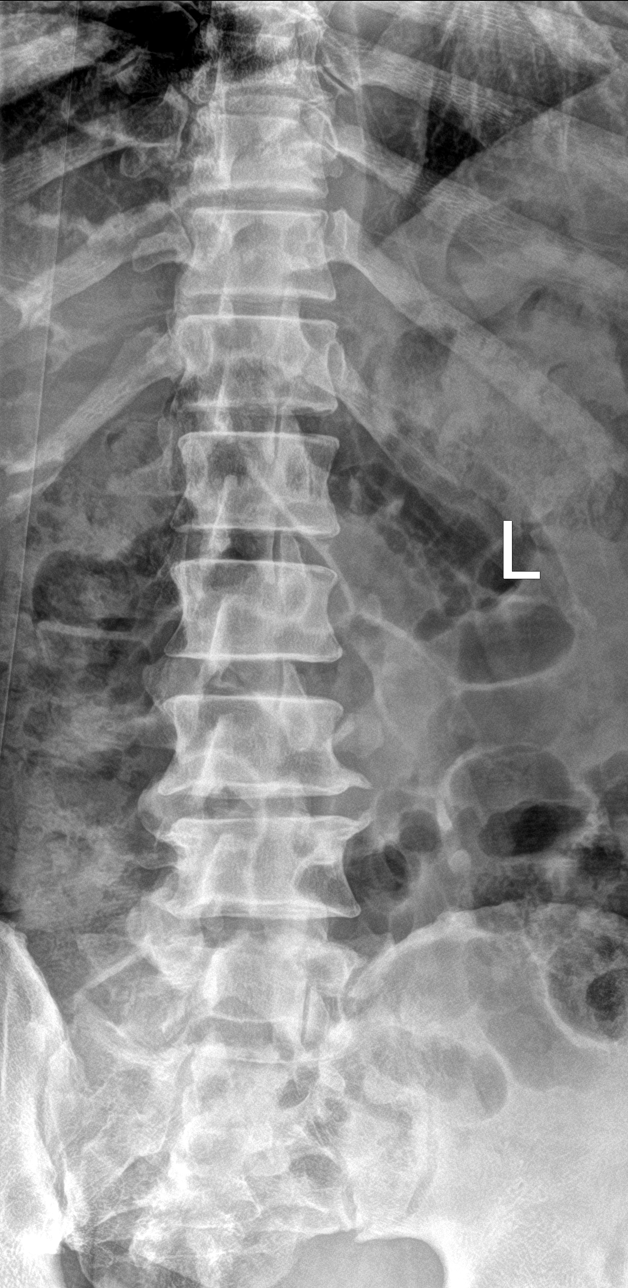

[l-spine lat]
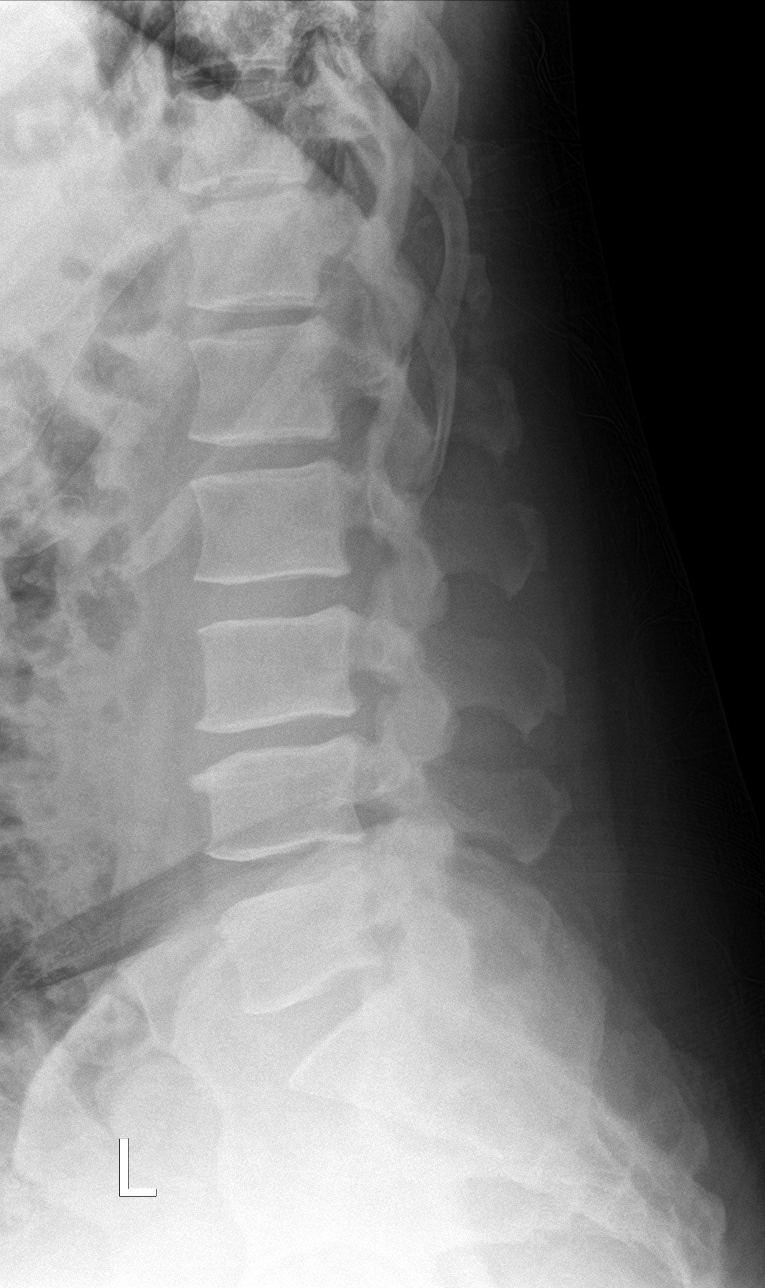

[l-spine spot]
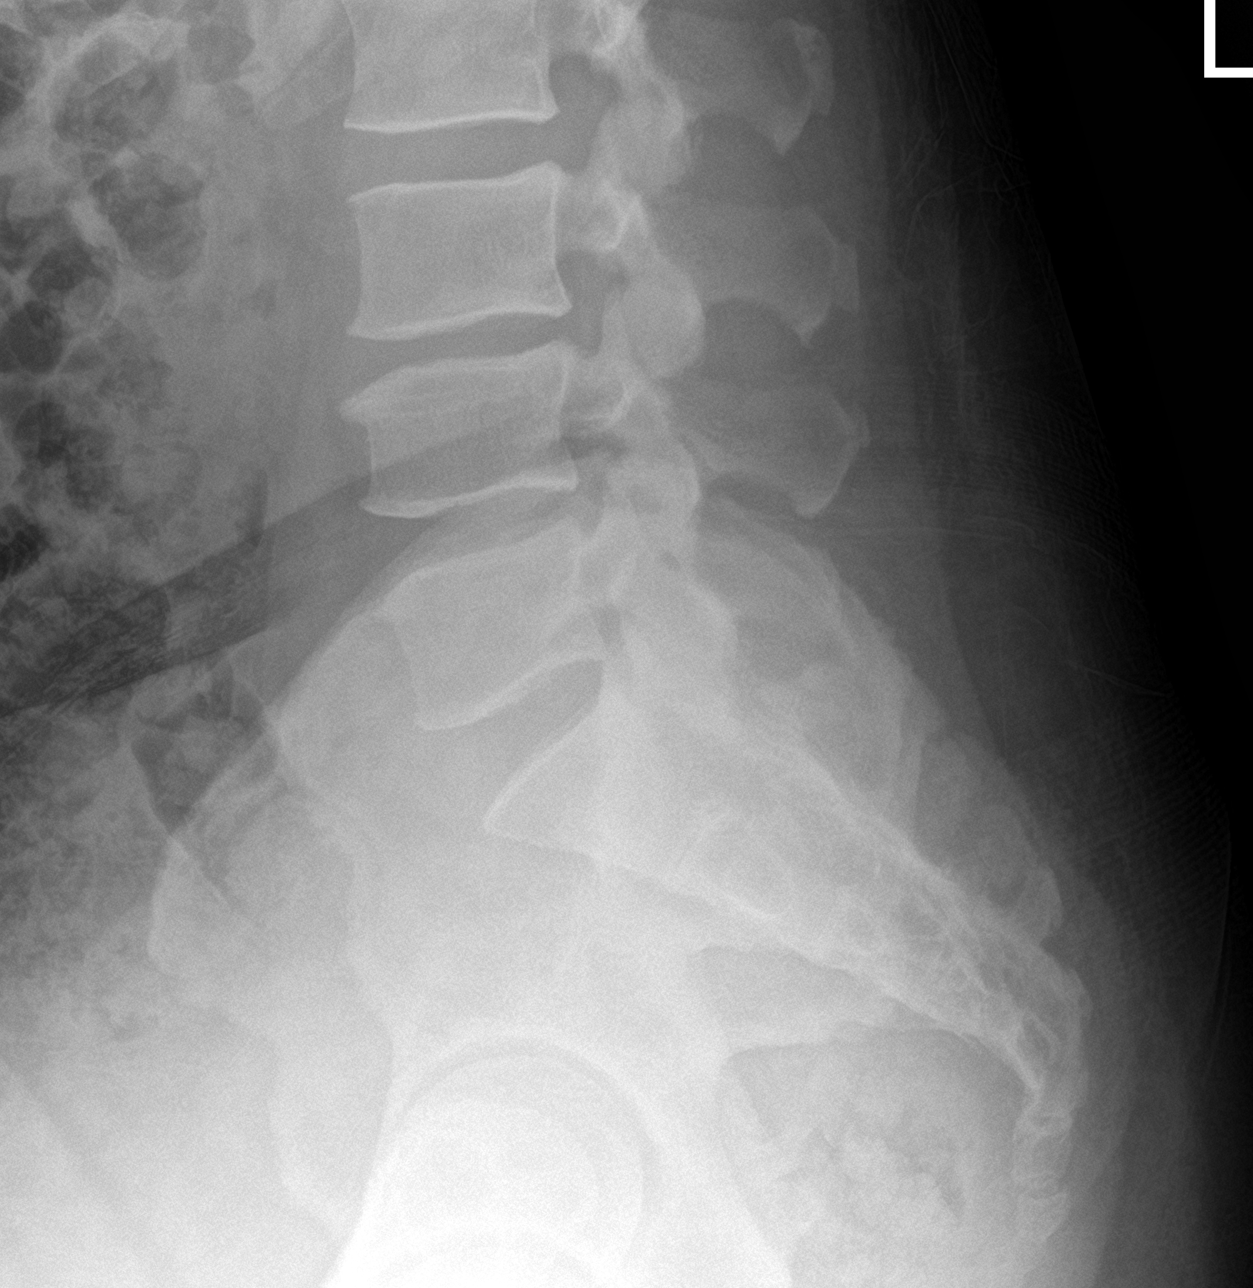

[5 of 5 positions shown; findings below may reference images not displayed]

FINDINGS: There is no evidence of lumbar spine fracture. Alignment is normal.
Slight narrowing of the L3-4 disc space, unchanged. No facet
arthritis.
IMPRESSION: No acute abnormality. Slight degenerative disc disease at L3-4.

## 2024-03-18 ENCOUNTER — Emergency Department

## 2024-03-18 ENCOUNTER — Encounter: Payer: Self-pay | Admitting: Intensive Care

## 2024-03-18 ENCOUNTER — Other Ambulatory Visit: Payer: Self-pay

## 2024-03-18 ENCOUNTER — Inpatient Hospital Stay
Admission: EM | Admit: 2024-03-18 | Discharge: 2024-03-20 | DRG: 552 | Disposition: A | Discharge status: Home / Self Care | Attending: Internal Medicine | Admitting: Internal Medicine

## 2024-03-18 DIAGNOSIS — J189 Pneumonia, unspecified organism: Secondary | ICD-10-CM

## 2024-03-18 DIAGNOSIS — G8929 Other chronic pain: Secondary | ICD-10-CM | POA: Diagnosis not present

## 2024-03-18 DIAGNOSIS — R918 Other nonspecific abnormal finding of lung field: Secondary | ICD-10-CM

## 2024-03-18 DIAGNOSIS — A419 Sepsis, unspecified organism: Principal | ICD-10-CM

## 2024-03-18 DIAGNOSIS — R0902 Hypoxemia: Secondary | ICD-10-CM | POA: Diagnosis present

## 2024-03-18 DIAGNOSIS — M48061 Spinal stenosis, lumbar region without neurogenic claudication: Secondary | ICD-10-CM | POA: Diagnosis present

## 2024-03-18 DIAGNOSIS — M4804 Spinal stenosis, thoracic region: Secondary | ICD-10-CM | POA: Diagnosis present

## 2024-03-18 DIAGNOSIS — W19XXXA Unspecified fall, initial encounter: Secondary | ICD-10-CM | POA: Diagnosis present

## 2024-03-18 DIAGNOSIS — M4722 Other spondylosis with radiculopathy, cervical region: Secondary | ICD-10-CM | POA: Diagnosis present

## 2024-03-18 DIAGNOSIS — N179 Acute kidney failure, unspecified: Secondary | ICD-10-CM | POA: Diagnosis present

## 2024-03-18 DIAGNOSIS — R6510 Systemic inflammatory response syndrome (SIRS) of non-infectious origin without acute organ dysfunction: Secondary | ICD-10-CM | POA: Diagnosis present

## 2024-03-18 DIAGNOSIS — M4802 Spinal stenosis, cervical region: Secondary | ICD-10-CM | POA: Diagnosis present

## 2024-03-18 DIAGNOSIS — R202 Paresthesia of skin: Secondary | ICD-10-CM | POA: Diagnosis not present

## 2024-03-18 DIAGNOSIS — M549 Dorsalgia, unspecified: Secondary | ICD-10-CM

## 2024-03-18 DIAGNOSIS — R079 Chest pain, unspecified: Secondary | ICD-10-CM

## 2024-03-18 DIAGNOSIS — R509 Fever, unspecified: Secondary | ICD-10-CM

## 2024-03-18 DIAGNOSIS — R7989 Other specified abnormal findings of blood chemistry: Secondary | ICD-10-CM | POA: Diagnosis not present

## 2024-03-18 DIAGNOSIS — M4726 Other spondylosis with radiculopathy, lumbar region: Principal | ICD-10-CM | POA: Diagnosis present

## 2024-03-18 LAB — RESPIRATORY PANEL BY PCR

## 2024-03-18 LAB — COMPREHENSIVE METABOLIC PANEL WITH GFR
ALT: 16 U/L (ref 0–44)
AST: 27 U/L (ref 15–41)
Albumin: 3 g/dL — ABNORMAL LOW (ref 3.5–5.0)
Alkaline Phosphatase: 45 U/L (ref 38–126)
Anion gap: 7 (ref 5–15)
BUN: 26 mg/dL — ABNORMAL HIGH (ref 6–20)
CO2: 25 mmol/L (ref 22–32)
Calcium: 8.1 mg/dL — ABNORMAL LOW (ref 8.9–10.3)
Chloride: 104 mmol/L (ref 98–111)
Creatinine, Ser: 1.33 mg/dL — ABNORMAL HIGH (ref 0.61–1.24)
GFR, Estimated: >60 mL/min (ref >60)
Glucose, Bld: 132 mg/dL — ABNORMAL HIGH (ref 70–99)
Potassium: 4 mmol/L (ref 3.5–5.1)
Sodium: 136 mmol/L (ref 135–145)
Total Bilirubin: 1.4 mg/dL — ABNORMAL HIGH (ref 0.0–1.2)
Total Protein: 6.3 g/dL — ABNORMAL LOW (ref 6.5–8.1)

## 2024-03-18 LAB — CBC WITH DIFFERENTIAL/PLATELET
Abs Immature Granulocytes: 0.04 10*3/uL (ref 0.00–0.07)
Basophils Absolute: 0 10*3/uL (ref 0.0–0.1)
Basophils Relative: 0 %
Eosinophils Absolute: 0 10*3/uL (ref 0.0–0.5)
Eosinophils Relative: 0 %
HCT: 37.7 % — ABNORMAL LOW (ref 39.0–52.0)
Hemoglobin: 12.7 g/dL — ABNORMAL LOW (ref 13.0–17.0)
Immature Granulocytes: 0 %
Lymphocytes Relative: 3 %
Lymphs Abs: 0.3 10*3/uL — ABNORMAL LOW (ref 0.7–4.0)
MCH: 30.8 pg (ref 26.0–34.0)
MCHC: 33.7 g/dL (ref 30.0–36.0)
MCV: 91.5 fL (ref 80.0–100.0)
Monocytes Absolute: 0.1 10*3/uL (ref 0.1–1.0)
Monocytes Relative: 1 %
Neutro Abs: 11.7 10*3/uL — ABNORMAL HIGH (ref 1.7–7.7)
Neutrophils Relative %: 96 %
Platelets: 124 10*3/uL — ABNORMAL LOW (ref 150–400)
RBC: 4.12 MIL/uL — ABNORMAL LOW (ref 4.22–5.81)
RDW: 13.6 % (ref 11.5–15.5)
Smear Review: NORMAL
WBC: 12.2 10*3/uL — ABNORMAL HIGH (ref 4.0–10.5)
nRBC: 0 % (ref 0.0–0.2)

## 2024-03-18 LAB — PROTIME-INR
INR: 1.3 — ABNORMAL HIGH (ref 0.8–1.2)
Prothrombin Time: 16 s — ABNORMAL HIGH (ref 11.4–15.2)

## 2024-03-18 LAB — URINALYSIS, W/ REFLEX TO CULTURE (INFECTION SUSPECTED)
Bacteria, UA: NONE SEEN
Bilirubin Urine: NEGATIVE
Glucose, UA: NEGATIVE mg/dL
Hgb urine dipstick: NEGATIVE
Ketones, ur: NEGATIVE mg/dL
Nitrite: NEGATIVE
Protein, ur: NEGATIVE mg/dL
Specific Gravity, Urine: 1.012 (ref 1.005–1.030)
Squamous Epithelial / HPF: 0 /HPF (ref 0–5)
pH: 7 (ref 5.0–8.0)

## 2024-03-18 LAB — LACTIC ACID, PLASMA
Lactic Acid, Venous: 1.9 mmol/L (ref 0.5–1.9)
Lactic Acid, Venous: 1.2 mmol/L (ref 0.5–1.9)

## 2024-03-18 LAB — RESP PANEL BY RT-PCR (RSV, FLU A&B, COVID)  RVPGX2
Influenza A by PCR: NEGATIVE
Influenza B by PCR: NEGATIVE
Resp Syncytial Virus by PCR: NEGATIVE
SARS Coronavirus 2 by RT PCR: NEGATIVE

## 2024-03-18 LAB — PROCALCITONIN: Procalcitonin: 56.72 ng/mL

## 2024-03-18 LAB — APTT: aPTT: 30 s (ref 24–36)

## 2024-03-18 LAB — TROPONIN I (HIGH SENSITIVITY)
Troponin I (High Sensitivity): 4 ng/L (ref <18)
Troponin I (High Sensitivity): 7 ng/L (ref <18)

## 2024-03-18 LAB — C-REACTIVE PROTEIN: CRP: 7.5 mg/dL — ABNORMAL HIGH (ref <1.0)

## 2024-03-18 LAB — SEDIMENTATION RATE: Sed Rate: 26 mm/h — ABNORMAL HIGH (ref 0–20)

## 2024-03-18 MED ORDER — ONDANSETRON HCL 4 MG/2ML IJ SOLN: 4.0000 mg | Freq: Four times a day (QID) | INTRAMUSCULAR | Status: DC | PRN

## 2024-03-18 MED ORDER — POLYETHYLENE GLYCOL 3350 17 G PO PACK: 17.0000 g | PACK | Freq: Every day | ORAL | Status: DC | PRN

## 2024-03-18 MED ORDER — SODIUM CHLORIDE 0.9 % IV SOLN
2.0000 g | Freq: Once | INTRAVENOUS | Status: AC
  Administered 2024-03-18: 2 g via INTRAVENOUS
  Filled 2024-03-18: qty 12.5

## 2024-03-18 MED ORDER — ONDANSETRON HCL 4 MG PO TABS: 4.0000 mg | ORAL_TABLET | Freq: Four times a day (QID) | ORAL | Status: DC | PRN

## 2024-03-18 MED ORDER — ENOXAPARIN SODIUM 60 MG/0.6ML IJ SOSY
50.0000 mg | PREFILLED_SYRINGE | INTRAMUSCULAR | Status: DC
  Administered 2024-03-18 – 2024-03-19 (×2): 50 mg via SUBCUTANEOUS
  Filled 2024-03-18 (×3): qty 0.6

## 2024-03-18 MED ORDER — IOHEXOL 350 MG/ML SOLN
100.0000 mL | Freq: Once | INTRAVENOUS | Status: AC | PRN
  Administered 2024-03-18: 100 mL via INTRAVENOUS

## 2024-03-18 MED ORDER — ACETAMINOPHEN 650 MG RE SUPP: 650.0000 mg | Freq: Four times a day (QID) | RECTAL | Status: DC | PRN

## 2024-03-18 MED ORDER — ACETAMINOPHEN 325 MG PO TABS: 650.0000 mg | ORAL_TABLET | Freq: Four times a day (QID) | ORAL | Status: DC | PRN

## 2024-03-18 MED ORDER — LACTATED RINGERS IV BOLUS (SEPSIS)
500.0000 mL | Freq: Once | INTRAVENOUS | Status: AC
  Administered 2024-03-18: 500 mL via INTRAVENOUS

## 2024-03-18 MED ORDER — LACTATED RINGERS IV BOLUS (SEPSIS): 1000.0000 mL | Freq: Once | INTRAVENOUS | Status: DC

## 2024-03-18 MED ORDER — LACTATED RINGERS IV BOLUS
1000.0000 mL | Freq: Once | INTRAVENOUS | Status: AC
  Administered 2024-03-18: 1000 mL via INTRAVENOUS

## 2024-03-18 MED ORDER — OXYCODONE HCL 5 MG PO TABS: 5.0000 mg | ORAL_TABLET | Freq: Four times a day (QID) | ORAL | Status: DC | PRN

## 2024-03-18 MED ORDER — SODIUM CHLORIDE 0.9% FLUSH
3.0000 mL | Freq: Two times a day (BID) | INTRAVENOUS | Status: DC
  Administered 2024-03-18 – 2024-03-20 (×5): 3 mL via INTRAVENOUS

## 2024-03-18 MED ORDER — METRONIDAZOLE 500 MG/100ML IV SOLN
500.0000 mg | Freq: Once | INTRAVENOUS | Status: AC
  Administered 2024-03-18: 500 mg via INTRAVENOUS
  Filled 2024-03-18: qty 100

## 2024-03-18 MED ORDER — VANCOMYCIN HCL IN DEXTROSE 1-5 GM/200ML-% IV SOLN
1000.0000 mg | Freq: Once | INTRAVENOUS | Status: AC
  Administered 2024-03-18: 1000 mg via INTRAVENOUS
  Filled 2024-03-18: qty 200

## 2024-03-18 MED ORDER — ACETAMINOPHEN 325 MG PO TABS
325.0000 mg | ORAL_TABLET | Freq: Once | ORAL | Status: AC
  Administered 2024-03-18: 325 mg via ORAL
  Filled 2024-03-18: qty 1

## 2024-03-18 MED ORDER — KETOROLAC TROMETHAMINE 15 MG/ML IJ SOLN
15.0000 mg | Freq: Four times a day (QID) | INTRAMUSCULAR | Status: DC
  Administered 2024-03-18 – 2024-03-20 (×8): 15 mg via INTRAVENOUS
  Filled 2024-03-18 (×10): qty 1

## 2024-03-18 MED ORDER — LACTATED RINGERS IV BOLUS (SEPSIS)
1000.0000 mL | Freq: Once | INTRAVENOUS | Status: AC
  Administered 2024-03-18: 1000 mL via INTRAVENOUS

## 2024-03-18 MED ORDER — GADOBUTROL 1 MMOL/ML IV SOLN
10.0000 mL | Freq: Once | INTRAVENOUS | Status: AC | PRN
  Administered 2024-03-18: 10 mL via INTRAVENOUS

## 2024-03-18 MED ORDER — HYDROMORPHONE HCL 1 MG/ML IJ SOLN: 0.5000 mg | INTRAMUSCULAR | Status: DC | PRN

## 2024-03-18 NOTE — Assessment & Plan Note (Signed)
 Patient's creatinine is elevated at 1.33 with GFR maintained above 60.  - S/p 3.5 L bolus in the ED.  No further fluids at this time

## 2024-03-18 NOTE — Sepsis Progress Note (Signed)
 Elink following code sepsis

## 2024-03-18 NOTE — ED Provider Notes (Addendum)
 [facility] Provider Note    Event Date/Time   First MD Initiated Contact with Patient 03/18/24 [phone]     (approximate)   History   Back Pain   HPI  [name] [name] is a 63 y.o. male with history of sciatica, is a truck driver, presenting with right-sided aching, numbness tingling.  Was found to be hypotensive, tachycardic, hypoxic.  Per EMS he was complaining about back pain, was also feeling lightheaded.  Was given 650 mg of Tylenol by EMS.  Per patient's significant other, he almost fell out of his truck yesterday, caught himself on his right arm.  Did not fall to the ground.  Thinks that he may have exacerbated his right shoulder and that might have been causing his aching.  Patient states that he has aching to the right shoulder, right anterior chest, right lower back.  States that the back pain is different from his sciatica.  No weakness, no incontinence, no saddle anesthesia, no retention, no urinary changes.  He denies any cough.  No headache.  No prior history of cardiac issues or stroke.  Denies IV drug use, drug use or alcohol use.  No history of any respiratory issues.  Not oxygen at baseline.  He also denies any recent back instrumentation, injections, surgery.  Independent history obtained from significant other as well as EMS.       Physical Exam   Triage Vital Signs: ED Triage Vitals  Encounter Vitals Group     BP 03/18/24 0831 (!) 87/45     Systolic BP Percentile --      Diastolic BP Percentile --      Pulse Rate 03/18/24 0831 (!) 110     Resp 03/18/24 0831 20     Temp 03/18/24 0831 100 F (37.8 C)     Temp Source 03/18/24 0831 Oral     SpO2 03/18/24 0831 98 %     Weight 03/18/24 0832 240 lb (108.9 kg)     Height 03/18/24 0832 6\' 1"  (1.854 m)     Head Circumference --      Peak Flow --      Pain Score 03/18/24 0832 5     Pain Loc --      Pain Education --      Exclude from Growth Chart --     Most recent vital signs: Vitals:    03/18/24 0930 03/18/24 1100  BP: 126/74 130/83  Pulse: 88 68  Resp: 20 18  Temp:    SpO2: 98% 98%     General: Awake, no distress.  CV:  Good peripheral perfusion.  Resp:  Normal effort.  No respiratory distress, no increased work of breathing, desatted to 89%. Abd:  No distention.  Soft nontender Other:  No nuchal rigidity, no midline cervical tenderness, he has right upper parathoracic cage tenderness, no midline thoracic tenderness, he does have midline lumbar tenderness with right lower paralumbar tenderness to palpation.  Full range of motion of all extremities are intact, he has equal radial as well as DP pulses bilaterally.  No upper extremity weakness or numbness, he has no facial asymmetry or numbness, no lower extremity numbness, he does have mild weakness to the right lower extremity compared to the left that he says is secondary to pain.  Rectal tone is intact, brown stool in rectal vault, no saddle anesthesia.  He does have some tenderness to the right shoulder but he is able to fully range the shoulder  without any erythema, swelling.  No CVA tenderness.   ED Results / Procedures / Treatments   Labs (all labs ordered are listed, but only abnormal results are displayed) Labs Reviewed  COMPREHENSIVE METABOLIC PANEL WITH GFR - Abnormal; Notable for the following components:      Result Value   Glucose, Bld 132 (*)    BUN 26 (*)    Creatinine, Ser 1.33 (*)    Calcium 8.1 (*)    Total Protein 6.3 (*)    Albumin 3.0 (*)    Total Bilirubin 1.4 (*)    All other components within normal limits  CBC WITH DIFFERENTIAL/PLATELET - Abnormal; Notable for the following components:   WBC 12.2 (*)    RBC 4.12 (*)    Hemoglobin 12.7 (*)    HCT 37.7 (*)    Platelets 124 (*)    Neutro Abs 11.7 (*)    Lymphs Abs 0.3 (*)    All other components within normal limits  URINALYSIS, W/ REFLEX TO CULTURE (INFECTION SUSPECTED) - Abnormal; Notable for the following components:   Color, Urine  COLORLESS (*)    APPearance CLEAR (*)    Leukocytes,Ua TRACE (*)    All other components within normal limits  SEDIMENTATION RATE - Abnormal; Notable for the following components:   Sed Rate 26 (*)    All other components within normal limits  PROTIME-INR - Abnormal; Notable for the following components:   Prothrombin Time 16.0 (*)    INR 1.3 (*)    All other components within normal limits  RESP PANEL BY RT-PCR (RSV, FLU A&B, COVID)  [id number]  CULTURE, BLOOD (ROUTINE X 2)  CULTURE, BLOOD (ROUTINE X 2)  LACTIC ACID, PLASMA  LACTIC ACID, PLASMA  APTT  C-REACTIVE PROTEIN  TROPONIN I (HIGH SENSITIVITY)  TROPONIN I (HIGH SENSITIVITY)     EKG  Sinus tachycardia, rate 101, normal QRS, normal QTc, T wave flattening in aVL, no ischemic ST elevation, no prior to compare   RADIOLOGY Chest x-ray on my independent interpretation without obvious consolidation.   PROCEDURES:  Critical Care performed: Yes, see critical care procedure note(s)  .Critical Care  Performed by: Claybon Jabs, MD Authorized by: Claybon Jabs, MD   Critical care provider statement:    Critical care time (minutes):  40   Critical care was necessary to treat or prevent imminent or life-threatening deterioration of the following conditions:  Sepsis   Critical care was time spent personally by me on the following activities:  Development of treatment plan with patient or surrogate, discussions with consultants, evaluation of patient's response to treatment, examination of patient, ordering and review of laboratory studies, ordering and review of radiographic studies, ordering and performing treatments and interventions, pulse oximetry, re-evaluation of patient's condition and review of old charts    MEDICATIONS ORDERED IN ED: Medications  lactated ringers bolus 1,000 mL (0 mLs Intravenous Stopped 03/18/24 1020)  lactated ringers bolus 1,000 mL (0 mLs Intravenous Stopped 03/18/24 1148)    And  lactated ringers  bolus 1,000 mL (1,000 mLs Intravenous New Bag/Given 03/18/24 1147)    And  lactated ringers bolus 500 mL (500 mLs Intravenous New Bag/Given 03/18/24 1157)  ceFEPIme (MAXIPIME) 2 g in sodium chloride 0.9 % 100 mL IVPB (0 g Intravenous Stopped 03/18/24 1148)  metroNIDAZOLE (FLAGYL) IVPB 500 mg (0 mg Intravenous Stopped 03/18/24 1148)  vancomycin (VANCOCIN) IVPB 1000 mg/200 mL premix (1,000 mg Intravenous New Bag/Given 03/18/24 1156)  acetaminophen (TYLENOL) tablet 325 mg (325  mg Oral Given 03/18/24 1016)  iohexol (OMNIPAQUE) 350 MG/ML injection 100 mL (100 mLs Intravenous Contrast Given 03/18/24 0957)  gadobutrol (GADAVIST) 1 MMOL/ML injection 10 mL (10 mLs Intravenous Contrast Given 03/18/24 1343)     IMPRESSION / MDM / ASSESSMENT AND PLAN / ED COURSE  I reviewed the triage vital signs and the nursing notes.                              Differential diagnosis includes, but is not limited to, sepsis, he is hypoxic, considered pneumonia, viral illness, COVID, influenza, RSV, UTI, pyelonephritis, discitis, osteomyelitis, epidural abscess, considered cauda equina but patient has no saddle anesthesia, intact rectal tone, no incontinence.  Also considered PE given that he has history of a truck driver and is hypoxic here, given that he also has pain in his chest that radiated down to his back, also considered dissection.  For his shoulder pain, does not appear septic, considered strain, able to fully range, considered but doubt fracture or dislocation, will get labs, EKG, troponin, blood cultures, lactic acid, chest x-ray, x-ray of his shoulder, CT angio of the chest abdomen pelvis, also get an MRI of his total spine with IV contrast.  Will provide sepsis fluid bolus, will empirically antibiosis him given that his rectal temp was 101.9.  Since EMS gave him 650 of Tylenol, will give him another 325mg .  He will need to be admitted for further management after imaging results are back as well as labs.  Shared  decision making done with patient and he is agreeable with the plan.  Patient's presentation is most consistent with acute presentation with potential threat to life or bodily function.  Independent review of labs and imaging are below.  On reexam, patient states that he has some right lower extremity tingling to his medial leg but no actual numbness or new weakness.  Discussed results so far and that we are still pending the MRI.    Patient states that he does not have any recent travel, no exposure to farm animals, no tick bites, no camping trips, denies history of STIs, HIV or syphilis.  MRI without evidence of discitis or other infectious etiology.  Given patient's overall presentation, will plan to have admitted for further management and observation.  Consult to hospitalist who is agreeable plan for admission and will evaluate the patient.  He is admitted.  Clinical Course as of 03/18/24 1424  Sat Mar 18, 2024  0924 Spoke to CT tech to time the contrast bolus for the CT dissection protocol such that we are able to see as much of the pulmonary vessels as possible. [TT]  0925 Patient's pulse ox with dip down into 89% on room air with good pleth.  Placed him on 2 L nasal cannula. [TT]  0930 Called MRI to expedite his scans. [TT]  0932 DG Shoulder Right IMPRESSION: No evidence of acute fracture or dislocation. Mild degenerative changes.   [TT]  0932 DG Chest Portable 1 View No active disease.  [TT]  1000 Independent review of labs, he is a leukocytosis, troponin is negative, lactate not elevated, respiratory viral panel is negative, he has a mild AKI, electrolyte severely deranged, he is today bili is 1.4, rest of LFTs are not elevated, he is no jaundice or right upper quadrant pain at this time.  His ESR is mildly elevated. [TT]  1051 CT Angio Chest/Abd/Pel for Dissection W and/or W/WO IMPRESSION: 1.  No evidence of acute aortic syndrome or aneurysm. No acute intrathoracic or  intra-abdominal process. 2. Few tiny ground-glass centrilobular nodules in the left lower lobe, likely infectious or inflammatory.  No pulmonary embolism. (In body of CT read)   [TT]  1240 Repeat lactate and troponin normal. [TT]  1350 Called radiology to get the reads expedited for his MRI. [TT]  1409 MR Lumbar Spine W Wo Contrast IMPRESSION: 1. Multilevel lumbar spondylosis, slightly progressed at L4-L5 where there is mild-to-moderate canal stenosis with bilateral subarticular recess stenosis and moderate-severe right and mild-moderate left foraminal stenosis. 2. Mild canal stenosis and moderate bilateral foraminal stenosis at L3-L4. 3. Generalized decreased T1 and T2 bone marrow signal throughout the included osseous structures is unchanged in appearance compared to the prior study. No discrete marrow replacing bone lesion. Findings are nonspecific and can be seen in the setting of chronic anemia, smoking, and/or obesity.   [TT]  1410 MR Cervical Spine W and Wo Contrast IMPRESSION: 1. No acute osseous abnormality of the cervical spine. 2. 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 Findings are most pronounced at the C5-6 level where there is moderate-severe canal stenosis and moderate-severe bilateral foraminal stenosis. 3. Moderate canal stenosis at C4-5 and C6-7. 4. Severe left and moderate-severe right foraminal stenosis at C6-7. 5. Generalized decreased T1 and T2 bone marrow signal throughout the cervical spine. This is nonspecific and can be seen in the setting of chronic anemia, smoking, and/or obesity.   [TT]  1424 MR THORACIC SPINE W WO CONTRAST IMPRESSION: 1. Mild degenerative changes of the thoracic spine. Mild foraminal stenosis on the right at T1-T2 and bilaterally at T2-T3. No canal stenosis at any level. 2. Generalized decreased T1 and T2 bone marrow signal throughout the thoracic spine. This is nonspecific and can be  seen in the setting of chronic anemia, smoking, and/or obesity.   [TT]    Clinical Course User Index [TT] Jodie Echevaria Franchot Erichsen, MD     FINAL CLINICAL IMPRESSION(S) / ED DIAGNOSES   Final diagnoses:  Acute back pain, unspecified back location, unspecified back pain laterality  Pneumonia of left lung due to infectious organism, unspecified part of lung  Sepsis, due to unspecified organism, unspecified whether acute organ dysfunction present (HCC)  Fever, unspecified fever cause  Chest pain, unspecified type  Hypoxia  Tingling  Pulmonary nodules     Rx / DC Orders   ED Discharge Orders     None        Note:  This document was prepared using Dragon voice recognition software and may include unintentional dictation errors.     Claybon Jabs, MD 03/18/24 1424    Claybon Jabs, MD 03/18/24 [phone]

## 2024-03-18 NOTE — ED Notes (Signed)
 Son at bedside.

## 2024-03-18 NOTE — ED Notes (Signed)
 Pt to MR

## 2024-03-18 NOTE — ED Notes (Signed)
 Patient ambulated to bathroom without incident

## 2024-03-18 NOTE — Consult Note (Signed)
 CODE SEPSIS - PHARMACY COMMUNICATION  **Broad Spectrum Antibiotics should be administered within 1 hour of Sepsis diagnosis**  Time Code Sepsis Called/Page Received: 0901  Antibiotics Ordered: Vancomycin, Cefepime, Metronidazole  Time of 1st antibiotic administration: 0915  Additional action taken by pharmacy: N/A  If necessary, Name of Provider/Nurse Contacted: N/A  Littie Deeds, PharmD Pharmacy Resident  03/18/2024 9:07 AM
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
## 2024-03-18 NOTE — ED Notes (Signed)
 PT to CT.

## 2024-03-18 NOTE — ED Triage Notes (Signed)
 Arrived by [facility] with back pain. Went to get out of his truck and slipped and held himself up grabbing with his right arm. Now experiencing back pain, right arm pain and nausea. Patient reports feeling dizzy and lightheaded.   650mg  tylenol given by EMS
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
## 2024-03-18 NOTE — ED Notes (Signed)
 Patient spouse provided recliner, blankets, and pillow.

## 2024-03-18 NOTE — H&P (Addendum)
 History and Physical    Patient: [name] [name] QIO:[id number] DOB: 1961-10-13 DOA: 03/18/2024 DOS: the patient was seen and examined on 03/18/2024 PCP: [facility]  Patient coming from: Home  Chief Complaint:  Chief Complaint  Patient presents with   Back Pain   HPI: [name] [name] is a 63 y.o. male with medical history significant of sciatica who presents to the ED due to back pain.  [name] states that yesterday, he was climbing out of his semitruck when he missed the last step and started to slip down.  He was able to catch himself using his right arm.  He denies any head trauma, loss of consciousness.  He denies any prodromal symptoms.  Afterwards, he was having significant right arm, right shoulder, right leg soreness with right lower back pain.  He is having numbness and tingling that radiates on the lateral aspect of his right thigh down to his right foot.  He states that is not quite the same as his past sciatica as it seems much worse.  He denies any focal weakness.  Today, when he woke up he was experiencing chills but did not check for fever.  When the pain was very severe and is back, he was experiencing shortness of breath but denies any shortness of breath at this time.  He denies any nausea, vomiting, diarrhea, abdominal pain, chest pain, palpitations, cough, rhinorrhea, sinus congestion.  He denies any dysuria, hematuria.  He denies any known sick contacts.  ED course: On arrival to the ED, patient was hypotensive at 87/45 with heart rate of 100.  Blood pressure improved to 96/62 with no intervention.  He was febrile at 101.4.  Patient was noted to be initially saturating at 98% on room air but desaturated to 89% was placed on 2 L.  Initial workup notable for WBC of 12.2, hemoglobin 12.7, platelets 124, BUN 26, creatinine 1.33, and GFR above 60.  COVID-19, influenza and RSV PCR negative.  Urinalysis with trace leukocytes only.  Patient underwent pan scan of both  chest/abdomen/pelvis in addition to full spinal MRI.  Results demonstrated multilevel degeneration.  CTA demonstrated no acute intrathoracic or intra-abdominal process, however few tiny GGO nodules in the left lower lobe.  Patient started on IV fluids, Flagyl, vancomycin, cefepime.  TRH contacted for admission.  Review of Systems: As mentioned in the history of present illness. All other systems reviewed and are negative.  History reviewed. No pertinent past medical history.  Past Surgical History:  Procedure Laterality Date   SHOULDER ARTHROSCOPY W/ ROTATOR CUFF REPAIR Left    Social History:  reports that he has never smoked. He has never used smokeless tobacco. He reports that he does not drink alcohol and does not use drugs.  No Known Allergies  History reviewed. No pertinent family history.  Prior to Admission medications   Medication Sig Start Date End Date Taking? Authorizing Provider  cyclobenzaprine (FLEXERIL) 10 MG tablet Take 1 tablet (10 mg total) by mouth 2 (two) times daily as needed for muscle spasms. 02/29/20   Roxy Horseman, PA-C  diphenhydrAMINE (BENADRYL) 25 MG tablet Take 1 tablet (25 mg total) by mouth every 6 (six) hours as needed for itching or allergies (Rash). 01/08/15   Szekalski, Kaitlyn, PA-C  ibuprofen (ADVIL) 800 MG tablet Take 1 tablet (800 mg total) by mouth every 8 (eight) hours as needed for moderate pain. 02/08/20   Lorelee New, PA-C  oxyCODONE-acetaminophen (PERCOCET) 7.5-325 MG tablet Take 1 tablet by  mouth every 6 (six) hours as needed for severe pain. 03/11/20   Caccavale, Sophia, PA-C  predniSONE (DELTASONE) 20 MG tablet Take 2 tablets (40 mg total) by mouth daily. Take 40 mg by mouth daily for 3 days, then 20mg  by mouth daily for 3 days, then 10mg  daily for 3 days 02/29/20   Roxy Horseman, PA-C  sulfamethoxazole-trimethoprim (SEPTRA DS) 800-160 MG per tablet Take 1 tablet by mouth 2 (two) times daily. Patient not taking: Reported on 02/29/2020  01/08/15   Emilia Beck, PA-C  tiZANidine (ZANAFLEX) 4 MG tablet Take 1 tablet (4 mg total) by mouth every 8 (eight) hours as needed for muscle spasms. 03/11/20   Alveria Apley, PA-C    Physical Exam: Vitals:   03/18/24 0909 03/18/24 0930 03/18/24 1100 03/18/24 1430  BP: 96/62 126/74 130/83 138/77  Pulse: 93 88 68 (!) 52  Resp: (!) 21 20 18 11   Temp: (!) 101.4 F (38.6 C)     TempSrc: Rectal     SpO2: 97% 98% 98% 100%  Weight:      Height:       Physical Exam Vitals and nursing note reviewed.  Constitutional:      General: He is not in acute distress.    Appearance: He is obese.  HENT:     Head: Normocephalic and atraumatic.     Mouth/Throat:     Mouth: Mucous membranes are moist.     Pharynx: Oropharynx is clear.  Eyes:     Conjunctiva/sclera: Conjunctivae normal.     Pupils: Pupils are equal, round, and reactive to light.  Cardiovascular:     Rate and Rhythm: Normal rate and regular rhythm.     Heart sounds: No murmur heard.    No gallop.  Pulmonary:     Effort: Pulmonary effort is normal. No respiratory distress.     Breath sounds: Normal breath sounds. No wheezing, rhonchi or rales.  Abdominal:     General: Bowel sounds are normal. There is no distension.     Palpations: Abdomen is soft.     Tenderness: There is no abdominal tenderness. There is no guarding.  Musculoskeletal:     Right lower leg: No edema.     Left lower leg: No edema.  Skin:    General: Skin is warm and dry.     Findings: No rash.  Neurological:     General: No focal deficit present.     Mental Status: He is alert and oriented to person, place, and time. Mental status is at baseline.  Psychiatric:        Mood and Affect: Mood normal.        Behavior: Behavior normal.    Data Reviewed: CBC with WBC 12.12, hemoglobin of 12.7, platelets 124 CMP with sodium of 136, potassium 4.0, bicarb 25, glucose 132, PN 26, creat 1.33, calcium 8.1, AST 27, ALT 16, GFR above 60 Troponin 4 and then  7 Lactic acid 1.9 and then 1.2 INR 1.3 ESR 26 COVID #19, influenza and RSV PCR negative Urinalysis with trace leukocytes only  EKG personal reviewed.  Sinus rhythm with rate of 101.  P wave shape concerning for pulmonary pattern.  No acute ischemic changes.  MR THORACIC SPINE W WO CONTRAST Result Date: 03/18/2024 CLINICAL DATA:  Back pain EXAM: MRI THORACIC WITHOUT AND WITH CONTRAST TECHNIQUE: Multiplanar and multiecho pulse sequences of the thoracic spine were obtained without and with intravenous contrast. CONTRAST:  10mL GADAVIST GADOBUTROL 1 MMOL/ML IV SOLN COMPARISON:  None Available. FINDINGS: Alignment:  Physiologic. Vertebrae: Vertebral body heights are maintained without fracture. No evidence of discitis. Generalized decreased T1 and T2 bone marrow signal throughout the thoracic spine. No discrete marrow replacing bone lesion. No abnormal postcontrast enhancement. Cord:  Normal signal and morphology. Paraspinal and other soft tissues: Negative. Disc levels: There are a few shallow noncompressive disc bulges, notably at T9-10. Facet hypertrophy is present within the upper thoracic and lower thoracic spine. Mild foraminal stenosis on the right at T1-T2. Mild bilateral foraminal stenosis at T2-T3. No additional sites of foraminal narrowing. No canal stenosis at any level. IMPRESSION: 1. Mild degenerative changes of the thoracic spine. Mild foraminal stenosis on the right at T1-T2 and bilaterally at T2-T3. No canal stenosis at any level. 2. Generalized decreased T1 and T2 bone marrow signal throughout the thoracic spine. This is nonspecific and can be seen in the setting of chronic anemia, smoking, and/or obesity. Electronically Signed   By: Duanne Guess D.O.   On: 03/18/2024 14:13   MR Cervical Spine W and Wo Contrast Result Date: 03/18/2024 CLINICAL DATA:  Right arm pain after fall EXAM: MRI CERVICAL SPINE WITHOUT AND WITH CONTRAST TECHNIQUE: Multiplanar and multiecho pulse sequences of the  cervical spine, to include the craniocervical junction and cervicothoracic junction, were obtained without and with intravenous contrast. CONTRAST:  10mL GADAVIST GADOBUTROL 1 MMOL/ML IV SOLN COMPARISON:  None Available. FINDINGS: Alignment: Straightening and reversal of the cervical lordosis. No traumatic listhesis. Vertebrae: No acute fracture. No evidence of discitis. Mild diffuse intrinsic canal narrowing on the basis of congenitally short pedicles. Generalized decreased T1 and T2 bone marrow signal throughout the cervical spine. No discrete marrow replacing bone lesion. No abnormal postcontrast enhancement. Cord: Normal signal and morphology. Posterior Fossa, vertebral arteries, paraspinal tissues: Negative. Disc levels: C2-C3: No disc protrusion. Mild facet hypertrophy. No foraminal or canal stenosis. C3-C4: Disc bulge with left greater than right facet and uncovertebral arthropathy. Severe left and mild-moderate right foraminal stenosis. Mild canal stenosis. C4-C5: Disc osteophyte complex with right greater than left facet and uncovertebral arthropathy. Moderate canal stenosis with moderate bilateral foraminal stenosis. C5-C6: Disc osteophyte complex with bilateral facet and uncovertebral arthropathy. Moderate-severe canal stenosis with moderate-severe bilateral foraminal stenosis. C6-C7: Disc osteophyte complex with bilateral facet and uncovertebral arthropathy. Moderate canal stenosis with severe left and moderate-severe right foraminal stenosis. C7-T1: Mild facet hypertrophy without foraminal or canal stenosis. IMPRESSION: 1. No acute osseous abnormality of the cervical spine. 2. 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 Findings are most pronounced at the C5-6 level where there is moderate-severe canal stenosis and moderate-severe bilateral foraminal stenosis. 3. Moderate canal stenosis at C4-5 and C6-7. 4. Severe left and moderate-severe right foraminal stenosis at  C6-7. 5. Generalized decreased T1 and T2 bone marrow signal throughout the cervical spine. This is nonspecific and can be seen in the setting of chronic anemia, smoking, and/or obesity. Electronically Signed   By: Duanne Guess D.O.   On: 03/18/2024 14:00   MR Lumbar Spine W Wo Contrast Result Date: 03/18/2024 CLINICAL DATA:  Back pain EXAM: MRI LUMBAR SPINE WITHOUT AND WITH CONTRAST TECHNIQUE: Multiplanar and multiecho pulse sequences of the lumbar spine were obtained without and with intravenous contrast. CONTRAST:  10mL GADAVIST GADOBUTROL 1 MMOL/ML IV SOLN COMPARISON:  03/11/2020 FINDINGS: Segmentation:  Standard. Alignment:  Physiologic. Vertebrae: No acute fracture. No evidence of discitis. Generalized decreased T1 and T2 bone marrow signal throughout the included osseous structures is unchanged in appearance compared to the  prior study. No discrete marrow replacing bone lesion. No abnormal postcontrast enhancement. Conus medullaris and cauda equina: Conus extends to the L1 level. Conus and cauda equina appear normal. Paraspinal and other soft tissues: Negative. Disc levels: T12-L1: No significant disc protrusion, foraminal stenosis, or canal stenosis. L1-L2: No significant disc protrusion, foraminal stenosis, or canal stenosis. L2-L3: Minimal annular disc bulge and mild bilateral facet arthropathy. No canal stenosis. Mild bilateral foraminal stenosis. Unchanged. L3-L4: Annular disc bulge with bilateral facet arthropathy. Bilateral subarticular recess stenosis with mild canal stenosis. Moderate bilateral foraminal stenosis. No significant interval progression. L4-L5: Annular disc bulge with bilateral facet arthropathy. Mild-to-moderate canal stenosis with bilateral subarticular recess stenosis. Moderate-severe right and mild-moderate left foraminal stenosis. Mild interval progression. L5-S1: No disc protrusion. Moderate facet arthropathy. No canal stenosis. Mild bilateral foraminal stenosis. No  significant interval progression. IMPRESSION: 1. Multilevel lumbar spondylosis, slightly progressed at L4-L5 where there is mild-to-moderate canal stenosis with bilateral subarticular recess stenosis and moderate-severe right and mild-moderate left foraminal stenosis. 2. Mild canal stenosis and moderate bilateral foraminal stenosis at L3-L4. 3. Generalized decreased T1 and T2 bone marrow signal throughout the included osseous structures is unchanged in appearance compared to the prior study. No discrete marrow replacing bone lesion. Findings are nonspecific and can be seen in the setting of chronic anemia, smoking, and/or obesity. Electronically Signed   By: Duanne Guess D.O.   On: 03/18/2024 13:56   CT Angio Chest/Abd/Pel for Dissection W and/or W/WO Result Date: 03/18/2024 CLINICAL DATA:  Back and right arm pain. Nausea, dizziness, and lightheadedness. EXAM: CT ANGIOGRAPHY CHEST, ABDOMEN AND PELVIS TECHNIQUE: Non-contrast CT of the chest was initially obtained. Multidetector CT imaging through the chest, abdomen and pelvis was performed using the standard protocol during bolus administration of intravenous contrast. Multiplanar reconstructed images and MIPs were obtained and reviewed to evaluate the vascular anatomy. RADIATION DOSE REDUCTION: This exam was performed according to the departmental dose-optimization program which includes automated exposure control, adjustment of the mA and/or kV according to patient size and/or use of iterative reconstruction technique. CONTRAST:  OMNIPAQUE IOHEXOL 350 MG/ML SOLN COMPARISON:  Chest x-ray from same day. FINDINGS: CTA CHEST FINDINGS Cardiovascular: No evidence thoracic intramural hematoma on noncontrast images. No evidence of thoracic aortic aneurysm or dissection. Normal heart size. No pericardial effusion. No pulmonary embolism. Mediastinum/Nodes: No enlarged mediastinal, hilar, or axillary lymph nodes. Thyroid gland, trachea, and esophagus demonstrate  no significant findings. Lungs/Pleura: There are a few tiny ground-glass centrilobular nodules in the left lower lobe. No focal consolidation, pleural effusion, or pneumothorax. Musculoskeletal: No chest wall abnormality. No acute or significant osseous findings. Review of the MIP images confirms the above findings. CTA ABDOMEN AND PELVIS FINDINGS VASCULAR Aorta: Normal caliber aorta without aneurysm, dissection, vasculitis or significant stenosis. Celiac: Patent without evidence of aneurysm, dissection, vasculitis or significant stenosis. SMA: Patent without evidence of aneurysm, dissection, vasculitis or significant stenosis. Renals: Both renal arteries are patent without evidence of aneurysm, dissection, vasculitis, fibromuscular dysplasia or significant stenosis. IMA: Patent without evidence of aneurysm, dissection, vasculitis or significant stenosis. Inflow: Patent without evidence of aneurysm, dissection, vasculitis or significant stenosis. Veins: No obvious venous abnormality within the limitations of this arterial phase study. Review of the MIP images confirms the above findings. NON-VASCULAR Hepatobiliary: No focal liver abnormality is seen. No gallstones, gallbladder wall thickening, or biliary dilatation. Pancreas: Unremarkable. No pancreatic ductal dilatation or surrounding inflammatory changes. Spleen: Normal in size without focal abnormality. Adrenals/Urinary Tract: Adrenal glands are unremarkable. Kidneys are normal, without  renal calculi, focal lesion, or hydronephrosis. Bladder is unremarkable. Stomach/Bowel: Stomach is within normal limits. Appendix appears normal. No evidence of bowel wall thickening, distention, or inflammatory changes. Lymphatic: No enlarged abdominal or pelvic lymph nodes. Reproductive: Borderline prostatomegaly. Other: No abdominal wall hernia or abnormality. No abdominopelvic ascites. No pneumoperitoneum. Musculoskeletal: No acute or significant osseous findings. Review of  the MIP images confirms the above findings. IMPRESSION: 1. No evidence of acute aortic syndrome or aneurysm. No acute intrathoracic or intra-abdominal process. 2. Few tiny ground-glass centrilobular nodules in the left lower lobe, likely infectious or inflammatory. Electronically Signed   By: Obie Dredge M.D.   On: 03/18/2024 10:41   DG Shoulder Right Result Date: 03/18/2024 CLINICAL DATA:  Right shoulder and back pain following minor injury. EXAM: RIGHT SHOULDER - 2+ VIEW COMPARISON:  None Available. FINDINGS: The mineralization and alignment are normal. There is no evidence of acute fracture or dislocation. Mild acromioclavicular and glenohumeral degenerative changes. The subacromial space is preserved. No focal soft tissue abnormalities are identified. IMPRESSION: No evidence of acute fracture or dislocation. Mild degenerative changes. Electronically Signed   By: Carey Bullocks M.D.   On: 03/18/2024 09:29   DG Chest Portable 1 View Result Date: 03/18/2024 CLINICAL DATA:  Hypoxia.  Right shoulder pain.  Status post fall EXAM: PORTABLE CHEST 1 VIEW COMPARISON:  01/08/2015 FINDINGS: Heart size and mediastinal contours appear normal. No pleural fluid, interstitial edema or airspace disease. Visualized osseous structures appear intact. IMPRESSION: No active disease. Electronically Signed   By: Signa Kell M.D.   On: 03/18/2024 09:28   Results are pending, will review when available.  Assessment and Plan:  * SIRS (systemic inflammatory response syndrome) (HCC) Patient was initially presenting to the ED for acute on chronic back pain after a fall from his truck yesterday where he was able to catch himself.  On arrival here, there was initial concern for hypotension, however blood pressure self resolved.  Low-grade tachycardia and a fever of 101.4 noted as well with mild leukocytosis.  No source of infection identified at this time with workup including UA, CTA chest/abdomen/pelvis, chest x-ray.   Potentially a viral etiology.  - Telemetry monitoring - S/p 3.5 L bolus - Hold further IV fluids at this time - Check full respiratory viral panel - Procalcitonin - S/p vancomycin, cefepime and Flagyl.  Hold off on further antibiotics at this time  Elevated serum creatinine Patient's creatinine is elevated at 1.33 with GFR maintained above 60.  - S/p 3.5 L bolus in the ED.  No further fluids at this time  Acute on chronic back pain Patient has a history of chronic joint pain, exacerbated by a mechanical fall from struck.  Full spinal MRI notable only for chronic degenerative changes.  - Start Toradol every 6 hours - Breakthrough pain control with oxycodone.  Advance Care Planning:   Code Status: Full Code   Consults: None  Family Communication: Patient's fianc updated at bedside  Severity of Illness: The appropriate patient status for this patient is OBSERVATION. Observation status is judged to be reasonable and necessary in order to provide the required intensity of service to ensure the patient's safety. The patient's presenting symptoms, physical exam findings, and initial radiographic and laboratory data in the context of their medical condition is felt to place them at decreased risk for further clinical deterioration. Furthermore, it is anticipated that the patient will be medically stable for discharge from the hospital within 2 midnights of admission.   Author: Cira Servant  Huel Cote, MD 03/18/2024 4:53 PM  For on call review [email or web address].

## 2024-03-18 NOTE — ED Notes (Signed)
 Patient and family provided snacks and soda per request.

## 2024-03-18 NOTE — ED Notes (Signed)
 Pt trialed off [place] sat @ 98%

## 2024-03-18 NOTE — ED Notes (Signed)
 This RN called MR to see status of getting scan. ETA 

## 2024-03-19 DIAGNOSIS — M4722 Other spondylosis with radiculopathy, cervical region: Secondary | ICD-10-CM | POA: Diagnosis present

## 2024-03-19 DIAGNOSIS — G8929 Other chronic pain: Secondary | ICD-10-CM | POA: Diagnosis present

## 2024-03-19 DIAGNOSIS — N179 Acute kidney failure, unspecified: Secondary | ICD-10-CM | POA: Diagnosis present

## 2024-03-19 DIAGNOSIS — W19XXXA Unspecified fall, initial encounter: Secondary | ICD-10-CM | POA: Diagnosis present

## 2024-03-19 DIAGNOSIS — R0902 Hypoxemia: Secondary | ICD-10-CM | POA: Diagnosis present

## 2024-03-19 DIAGNOSIS — M545 Low back pain, unspecified: Secondary | ICD-10-CM

## 2024-03-19 DIAGNOSIS — M4802 Spinal stenosis, cervical region: Secondary | ICD-10-CM | POA: Diagnosis present

## 2024-03-19 DIAGNOSIS — M48061 Spinal stenosis, lumbar region without neurogenic claudication: Secondary | ICD-10-CM | POA: Diagnosis present

## 2024-03-19 DIAGNOSIS — R6510 Systemic inflammatory response syndrome (SIRS) of non-infectious origin without acute organ dysfunction: Secondary | ICD-10-CM | POA: Diagnosis present

## 2024-03-19 DIAGNOSIS — M4726 Other spondylosis with radiculopathy, lumbar region: Secondary | ICD-10-CM | POA: Diagnosis present

## 2024-03-19 DIAGNOSIS — R202 Paresthesia of skin: Secondary | ICD-10-CM | POA: Diagnosis present

## 2024-03-19 DIAGNOSIS — M4804 Spinal stenosis, thoracic region: Secondary | ICD-10-CM | POA: Diagnosis present

## 2024-03-19 DIAGNOSIS — R7989 Other specified abnormal findings of blood chemistry: Secondary | ICD-10-CM | POA: Diagnosis present

## 2024-03-19 LAB — CBC
HCT: 39 % (ref 39.0–52.0)
Hemoglobin: 13.1 g/dL (ref 13.0–17.0)
MCH: 30.5 pg (ref 26.0–34.0)
MCHC: 33.6 g/dL (ref 30.0–36.0)
MCV: 90.7 fL (ref 80.0–100.0)
Platelets: 102 10*3/uL — ABNORMAL LOW (ref 150–400)
RBC: 4.3 MIL/uL (ref 4.22–5.81)
RDW: 14 % (ref 11.5–15.5)
WBC: 5.8 10*3/uL (ref 4.0–10.5)
nRBC: 0 % (ref 0.0–0.2)

## 2024-03-19 LAB — BASIC METABOLIC PANEL WITH GFR
Anion gap: 7 (ref 5–15)
BUN: 17 mg/dL (ref 6–20)
CO2: 24 mmol/L (ref 22–32)
Calcium: 8.1 mg/dL — ABNORMAL LOW (ref 8.9–10.3)
Chloride: 107 mmol/L (ref 98–111)
Creatinine, Ser: 1.09 mg/dL (ref 0.61–1.24)
GFR, Estimated: >60 mL/min (ref >60)
Glucose, Bld: 109 mg/dL — ABNORMAL HIGH (ref 70–99)
Potassium: 3.7 mmol/L (ref 3.5–5.1)
Sodium: 138 mmol/L (ref 135–145)

## 2024-03-19 LAB — HIV ANTIBODY (ROUTINE TESTING W REFLEX): HIV Screen 4th Generation wRfx: NONREACTIVE

## 2024-03-19 MED ORDER — METHYLPREDNISOLONE SODIUM SUCC 125 MG IJ SOLR
125.0000 mg | Freq: Every day | INTRAMUSCULAR | Status: DC
  Administered 2024-03-19 – 2024-03-20 (×2): 125 mg via INTRAVENOUS
  Filled 2024-03-19 (×3): qty 2

## 2024-03-19 NOTE — Evaluation (Addendum)
 Physical Therapy Evaluation Patient Details Name: [name] [name] MRN: [id number] DOB: 06-01-1961 Today's Date: 03/19/2024  History of Present Illness  [name] [name] is a 63 y.o. male with medical history significant of sciatica who presents to the ED due to back pain. With radiculopathy in RLE   Clinical Impression  Pt received in bed with OT and PT joined to complete the C-Evaluation 2/2 to severity of the pt symptoms. 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 Pleasant and motivated who has significant history of back pain for which he has been treated with injections for pain management. Pt reported of her RLE feels weak and the knee buckles on him without warning. PT Assessment revealed decreased sensation in RLE, guarded Low back muscles, SLR positive at 20 deg, Lumbar quadrant Positive with radiculopathy, Muscle spasm noted in RLE with movement, Muscle tightness in R hamstring. Pt MRI has revealed multilevel involvement at L/S and C/S: 1. Multilevel lumbar spondylosis, slightly progressed at L4-L5 where there is mild-to-moderate canal stenosis with bilateral subarticular recess stenosis and moderate-severe right and mild-moderate left foraminal stenosis. 2. Mild canal stenosis and moderate bilateral foraminal stenosis at L3-L4.2. 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 Findings are most pronounced at the C5-6 level where there is moderate-severe canal stenosis and moderate-severe bilateral foraminal stenosis. 3. Moderate canal stenosis at C4-5 and C6-7. 4. Severe left and moderate-severe right foraminal stenosis at C6-7. Pt needed Sup with all functional mobility. Pt remains severely guarded due to pain increases with movement. PT will continue in acute to improve pain free movement. Pt will benefit from Out Pt PT intervention when cleared by MD.       If plan is discharge home, recommend the following: A little help with walking and/or transfers;Assistance  with cooking/housework;Assist for transportation   Can travel by private vehicle        Equipment Recommendations Cane  Recommendations for Other Services       Functional Status Assessment Patient has had a recent decline in their functional status and demonstrates the ability to make significant improvements in function in a reasonable and predictable amount of time.     Precautions / Restrictions Precautions Precautions: Fall Restrictions Weight Bearing Restrictions Per Provider Order: No      Mobility  Bed Mobility Overal bed mobility: Needs Assistance Bed Mobility: Sidelying to Sit, Rolling Rolling: Modified independent (Device/Increase time) Sidelying to sit: Modified independent (Device/Increase time)       General bed mobility comments: Educated pt on log rolling technique for bed mobility, good carry over    Transfers Overall transfer level: Needs assistance Equipment used: None Transfers: Sit to/from Stand, Bed to chair/wheelchair/BSC Sit to Stand: Supervision, Contact guard assist   Step pivot transfers: Supervision       General transfer comment: guarded    Ambulation/Gait Ambulation/Gait assistance: Supervision Gait Distance (Feet): 75 Feet Assistive device: None Gait Pattern/deviations: Step-through pattern, Decreased dorsiflexion - right, Decreased stance time - right, Decreased step length - right Gait velocity: dec     General Gait Details: decreased loading response and decreased control of TKE during stsance.  Stairs            Wheelchair Mobility     Tilt Bed    Modified Rankin (Stroke Patients Only)       Balance Overall balance assessment: Needs assistance Sitting-balance support: Feet supported Sitting balance-Leahy Scale: Normal     Standing balance support: No upper extremity supported Standing balance-Leahy  Scale: Good Standing balance comment: No LOB noted but guraded due to decreased sensation and quad  control                             Pertinent Vitals/Pain Pain Assessment Pain Assessment: 0-10 Pain Score: 3  Pain Location: Lower back, RLE Pain Descriptors / Indicators: Pressure, Numbness, Tingling, Spasm Pain Intervention(s): Monitored during session    Home Living Family/patient expects to be discharged to:: Private residence Living Arrangements: Children;Spouse/significant other Available Help at Discharge: Family Type of Home: House Home Access: Level entry     Alternate Level Stairs-Number of Steps: 8 Home Layout: Two level;Able to live on main level with bedroom/bathroom Home Equipment: Crutches      Prior Function Prior Level of Function : Driving;Working/employed;Independent/Modified Independent             Mobility Comments: Ind ADLs Comments: INd     Extremity/Trunk Assessment   Upper Extremity Assessment Upper Extremity Assessment: Overall WFL for tasks assessed    Lower Extremity Assessment Lower Extremity Assessment: RLE deficits/detail RLE Deficits / Details: Constant 3/10 pain L/S and RLE RLE: Unable to fully assess due to pain RLE Sensation: decreased light touch RLE Coordination: decreased gross motor       Communication   Communication Communication: No apparent difficulties    Cognition Arousal: Alert Behavior During Therapy: WFL for tasks assessed/performed                             Following commands: Intact       Cueing Cueing Techniques: Verbal cues     General Comments General comments (skin integrity, edema, etc.): Maintains constant pain 3/10 throughout session, increases with mobility    Exercises     Assessment/Plan    PT Assessment Patient needs continued PT services  PT Problem List Decreased strength;Decreased range of motion;Pain;Decreased activity tolerance       PT Treatment Interventions Gait training;Stair training    PT Goals (Current goals can be found in the Care Plan  section)  Acute Rehab PT Goals Patient Stated Goal: " Not hurt any more so that My R leg does not give out." PT Goal Formulation: With patient Time For Goal Achievement: 03/26/24 Potential to Achieve Goals: Good    Frequency Min 2X/week     Co-evaluation   Reason for Co-Treatment: Complexity of the patient's impairments (multi-system involvement)   OT goals addressed during session: Other (comment)       AM-PAC PT "6 Clicks" Mobility  Outcome Measure Help needed turning from your back to your side while in a flat bed without using bedrails?: None Help needed moving from lying on your back to sitting on the side of a flat bed without using bedrails?: None Help needed moving to and from a bed to a chair (including a wheelchair)?: A Little Help needed standing up from a chair using your arms (e.g., wheelchair or bedside chair)?: A Little Help needed to walk in hospital room?: A Little Help needed climbing 3-5 steps with a railing? : A Little 6 Click Score: 20    End of Session Equipment Utilized During Treatment: Gait belt Activity Tolerance: Patient limited by pain Patient left: in chair;with nursing/sitter in room Nurse Communication: Mobility status PT Visit Diagnosis: Pain;Difficulty in walking, not elsewhere classified (R26.2);Muscle weakness (generalized) (M62.81) Pain - Right/Left: Right Pain - part of body: Leg (Low back)  Time: 1100-1130 PT Time Calculation (min) (ACUTE ONLY): 30 min   Charges:   PT Evaluation $PT Eval Moderate Complexity: 1 Mod PT Treatments $Therapeutic Exercise: 8-22 mins PT General Charges $$ ACUTE PT VISIT: 1 Visit       Joshalyn Ancheta PT DPT 1:40 PM,03/19/24

## 2024-03-19 NOTE — Progress Notes (Signed)
 Progress Note   Patient: [name] [name]:[id number] DOB: 01-09-1961 DOA: 03/18/2024     0 DOS: the patient was seen and examined on 03/19/2024   Brief hospital course: From HPI "[name] [name] is a 63 y.o. male with medical history significant of sciatica who presents to the ED due to back pain.   [name] [name] states that yesterday, he was climbing out of his semitruck when he missed the last step and started to slip down.  He was able to catch himself using his right arm.  He denies any head trauma, loss of consciousness.  He denies any prodromal symptoms.  Afterwards, he was having significant right arm, right shoulder, right leg soreness with right lower back pain.  He is having numbness and tingling that radiates on the lateral aspect of his right thigh down to his right foot.  He states that is not quite the same as his past sciatica as it seems much worse.  He denies any focal weakness.   Today, when he woke up he was experiencing chills but did not check for fever.  When the pain was very severe and is back, he was experiencing shortness of breath but denies any shortness of breath at this time.  He denies any nausea, vomiting, diarrhea, abdominal pain, chest pain, palpitations, cough, rhinorrhea, sinus congestion.  He denies any dysuria, hematuria.  He denies any known sick contacts.   ED course: On arrival to the ED, patient was hypotensive at 87/45 with heart rate of 100.  Blood pressure improved to 96/62 with no intervention.  He was febrile at 101.4.  Patient was noted to be initially saturating at 98% on room air but desaturated to 89% was placed on 2 L.  Initial workup notable for WBC of 12.2, hemoglobin 12.7, platelets 124, BUN 26, creatinine 1.33, and GFR above 60.  COVID-19, influenza and RSV PCR negative.  Urinalysis with trace leukocytes only.  Patient underwent pan scan of both chest/abdomen/pelvis in addition to full spinal MRI.  Results demonstrated multilevel degeneration.  CTA  demonstrated no acute intrathoracic or intra-abdominal process, however few tiny GGO nodules in the left lower lobe.  Patient started on IV fluids, Flagyl, vancomycin, cefepime.  TRH contacted for admission.  "    Assessment and Plan:   Acute on chronic back pain Likely secondary to radiculitis Patient has a history of chronic joint pain, exacerbated by a mechanical fall from struck.   MRI of the spine showed findings of multilevel canal and foraminal stenosis with degenerative changes Patient having impaired sensation on the right lower extremity I have consulted neurosurgeon Dr. Adriana Simas Neurosurgery has recommended nonsurgical intervention at this time Recommendation for steroid therapy, and pain management Patient initiated on IV Solu-Medrol Continue Toradol Physical therapy and Occupational Therapy on board.   Breakthrough pain control with oxycodone.   SIRS (systemic inflammatory response syndrome) (HCC) Patient met SIRS criteria on arrival but no evidence of infection Continue telemetry monitoring Viral panel is negative Procalcitonin 56 Urinalysis does not show UTI Chest x-ray does not show pneumonia Patient is status post vancomycin, cefepime and Flagyl.   Hold off on further antibiotics at this time since there is no clear source of infection and temperature have normalized as well as a WBC   AKI-improved Patient's creatinine is elevated at 1.33 with GFR maintained above 60. Patient received IV fluid Monitor renal function Avoid nephrotoxic drugs     Advance Care Planning:   Code Status: Full Code  Consults: None   Family Communication: Patient's fianc updated at bedside    Subjective:  Patient seen and examined at bedside this morning Still having upper back pain Still complaining of numbness involving the upper extremities as well as the right lower leg Patient has a sensory impairment involving the right lower leg Case has been discussed with neurosurgeon  who recommended steroid therapy Denies nausea vomiting abdominal pain chest pain or cough  Physical Exam:  Mouth: Mucous membranes are moist.     Pharynx: Oropharynx is clear.  Eyes:     Conjunctiva/sclera: Conjunctivae normal.     Pupils: Pupils are equal, round, and reactive to light.  Cardiovascular:     Rate and Rhythm: Normal rate and regular rhythm.     Heart sounds: No murmur heard.    No gallop.  Pulmonary:     Effort: Pulmonary effort is normal. No respiratory distress.     Breath sounds: Normal breath sounds. No wheezing, rhonchi or rales.  Abdominal:     General: Bowel sounds are normal. There is no distension.     Palpations: Abdomen is soft.     Tenderness: There is no abdominal tenderness. There is no guarding.  Musculoskeletal:     Right lower leg: No edema.     Left lower leg: No edema.  Skin:    General: Skin is warm and dry.     Findings: No rash.  Neurological: Power 4/5 involving the right lower extremity however power 5/5 in all other extremities.  Decreased sensation noted to the right lower extremity Psychiatric:        Mood and Affect: Mood norma Vitals:   03/19/24 0400 03/19/24 0730 03/19/24 0739 03/19/24 1200  BP: 118/85  128/82 121/77  Pulse: (!) 59 60 (!) 50 60  Resp: 14 (!) 23 12 18   Temp:   (!) 97.5 F (36.4 C) 97.8 F (36.6 C)  TempSrc:   Oral Oral  SpO2: 97% 98% 99% 96%  Weight:      Height:        Data Reviewed:    Latest Ref Rng & Units 03/19/2024    6:36 AM 03/18/2024    8:45 AM 08/15/2011    9:55 PM  CBC  WBC 4.0 - 10.5 K/uL 5.8  12.2    Hemoglobin 13.0 - 17.0 g/dL 16.1  09.6  04.5   Hematocrit 39.0 - 52.0 % 39.0  37.7  38.0   Platelets 150 - 400 K/uL 102  124         Latest Ref Rng & Units 03/19/2024    6:36 AM 03/18/2024    8:45 AM 08/15/2011    9:55 PM  BMP  Glucose 70 - 99 mg/dL 409  811  97   BUN 6 - 20 mg/dL 17  26  21    Creatinine 0.61 - 1.24 mg/dL 9.14  7.82  9.56   Sodium 135 - 145 mmol/L 138  136  138    Potassium 3.5 - 5.1 mmol/L 3.7  4.0  4.0   Chloride 98 - 111 mmol/L 107  104  101   CO2 22 - 32 mmol/L 24  25    Calcium 8.9 - 10.3 mg/dL 8.1  8.1       Family Communication: No family present at bedside  Disposition: Currently requiring hospitalization given the need for IV steroid as well as neurosurgical input  Time spent: 50 minutes  Author: Loyce Dys, MD 03/19/2024 3:03 PM  For on  call review [email or web address].

## 2024-03-19 NOTE — Progress Notes (Signed)
 Occupational Therapy Evaluation Patient Details Name: [name] [name] MRN: [id number] DOB: [birth year] Today's Date: 03/19/2024   History of Present Illness   [name] [name] is a 63 y.o. male with medical history significant of sciatica who presents to the ED due to back pain.     Clinical Impressions Pt was seen for OT/PT evaluation this date. Prior to hospital admission, pt was indep in all ADL/IADLs, recently pain limited impacting ability for completion of IADL/ADLs. Pt lives in entry level home with multiple levels, able to live on main level if need be. Pt lives in home with 3 daughters and partner. Pt presents to acute OT demonstrating impaired ADL performance and functional mobility (See OT problem list for additional functional deficits). Pt currently requires CGA for OOB mobility with no DME use, amb ~137ft, no LOB noted. Pt reports constant 3/10 pain, and decreased sensation in RLE. Pt stated lower back spasms impact ROM which is what lead to his fall out of the trunk. Pt completed LB dressing, pulling up bilateral socks, RLE presenting with more difficulty due to pain. Pt reports family is very supportive and will assist him in all ADLs/IADLs as needed. Pt educated on log rolling technique for ease of bed mobility. Pt demonstrated good carryover of steps. Pt would benefit from skilled OT services to address noted impairments and functional limitations (see below for any additional details) in order to maximize safety and independence while minimizing falls risk and caregiver burden. OT will follow acutely.     If plan is discharge home, recommend the following:   A little help with bathing/dressing/bathroom;Assistance with cooking/housework;Assist for transportation;A little help with walking and/or transfers     Functional Status Assessment   Patient has had a recent decline in their functional status and demonstrates the ability to make significant improvements in function in a  reasonable and predictable amount of time.     Equipment Recommendations   Tub/shower seat     Recommendations for Other Services         Precautions/Restrictions   Precautions Precautions: Fall     Mobility Bed Mobility Overal bed mobility: Needs Assistance Bed Mobility: Sidelying to Sit, Rolling Rolling: Modified independent (Device/Increase time) Sidelying to sit: Modified independent (Device/Increase time)       General bed mobility comments: Educated pt on log rolling technique for bed mobility, good carry over    Transfers Overall transfer level: Needs assistance Equipment used: None Transfers: Sit to/from Stand Sit to Stand: Contact guard assist                  Balance Overall balance assessment: Needs assistance Sitting-balance support: Feet supported, No upper extremity supported Sitting balance-Leahy Scale: Normal Sitting balance - Comments: Seated on EOB for prolonged time, good dynamic sitting balance to reach for items outside BOS   Standing balance support: During functional activity, No upper extremity supported Standing balance-Leahy Scale: Good                             ADL either performed or assessed with clinical judgement   ADL Overall ADL's : Needs assistance/impaired Eating/Feeding: Independent                   Lower Body Dressing: Minimal assistance   Toilet Transfer: Contact guard assist (Simulated to recliner)           Functional mobility during ADLs: Contact guard assist General ADL Comments:  Pt completed LB dressing, pulling up bilateral socks, RLE presenting with more difficulty due to pain     Vision         Perception         Praxis         Pertinent Vitals/Pain Pain Assessment Pain Assessment: 0-10 Pain Score: 3  Pain Location: Lower back Pain Descriptors / Indicators: Pressure     Extremity/Trunk Assessment Upper Extremity Assessment Upper Extremity Assessment:  Overall WFL for tasks assessed   Lower Extremity Assessment Lower Extremity Assessment: RLE deficits/detail RLE Deficits / Details: Constant 3/10 pain L/S and RLE RLE Sensation: decreased light touch RLE Coordination: decreased gross motor       Communication Communication Communication: No apparent difficulties   Cognition Arousal: Alert Behavior During Therapy: WFL for tasks assessed/performed Cognition: No apparent impairments             OT - Cognition Comments: A/Ox4                 Following commands: Intact       Cueing  General Comments   Cueing Techniques: Verbal cues  Maintains constant pain 3/10 throughout session, increases with mobility   Exercises Exercises: Other exercises Other Exercises Other Exercises: Edu: Role of outpatient OT, safe ADL completion, log rolling technique, DME recomendations   Shoulder Instructions      Home Living Family/patient expects to be discharged to:: Private residence Living Arrangements: Children;Spouse/significant other Available Help at Discharge: Family Type of Home: House Home Access: Level entry     Home Layout: Two level;Able to live on main level with bedroom/bathroom Alternate Level Stairs-Number of Steps: 8 Alternate Level Stairs-Rails: Left Bathroom Shower/Tub: Producer, television/film/video: Standard Bathroom Accessibility: Yes How Accessible: Accessible via walker Home Equipment: Crutches          Prior Functioning/Environment Prior Level of Function : Driving;Working/employed;Independent/Modified Independent                    OT Problem List: Decreased knowledge of use of DME or AE;Impaired sensation;Impaired balance (sitting and/or standing)   OT Treatment/Interventions: Self-care/ADL training;DME and/or AE instruction;Energy conservation;Patient/family education;Therapeutic exercise      OT Goals(Current goals can be found in the care plan section)   Acute Rehab OT  Goals Patient Stated Goal: to feel better OT Goal Formulation: With patient Time For Goal Achievement: 04/02/24 Potential to Achieve Goals: Good   OT Frequency:  Min 2X/week    Co-evaluation PT/OT/SLP Co-Evaluation/Treatment: Yes Reason for Co-Treatment: Complexity of the patient's impairments (multi-system involvement)   OT goals addressed during session: ADL's and self-care      AM-PAC OT "6 Clicks" Daily Activity     Outcome Measure Help from another person eating meals?: None Help from another person taking care of personal grooming?: None Help from another person toileting, which includes using toliet, bedpan, or urinal?: A Little Help from another person bathing (including washing, rinsing, drying)?: A Little Help from another person to put on and taking off regular upper body clothing?: None Help from another person to put on and taking off regular lower body clothing?: A Little 6 Click Score: 21   End of Session Nurse Communication: Mobility status  Activity Tolerance: Patient tolerated treatment well Patient left: in chair;with call bell/phone within reach  OT Visit Diagnosis: Unsteadiness on feet (R26.81);Other abnormalities of gait and mobility (R26.89);Other symptoms and signs involving the nervous system (R29.898)  Time: 1102-1130 OT Time Calculation (min): 28 min Charges:  OT General Charges $OT Visit: 1 Visit OT Evaluation $OT Eval Low Complexity: 1 Low OT Treatments $Self Care/Home Management : 8-22 mins  Glenard Haring M.S. OTR/L  03/19/24, 1:01 PM

## 2024-03-20 ENCOUNTER — Other Ambulatory Visit: Payer: Self-pay

## 2024-03-20 DIAGNOSIS — R6510 Systemic inflammatory response syndrome (SIRS) of non-infectious origin without acute organ dysfunction: Secondary | ICD-10-CM | POA: Diagnosis not present

## 2024-03-20 MED ORDER — ACETAMINOPHEN 325 MG PO TABS
650.0000 mg | ORAL_TABLET | Freq: Four times a day (QID) | ORAL | 0 refills | Status: AC | PRN
  Filled 2024-03-20: qty 20, 3d supply, fill #0

## 2024-03-20 MED ORDER — IBUPROFEN 800 MG PO TABS
800.0000 mg | ORAL_TABLET | Freq: Three times a day (TID) | ORAL | 0 refills | Status: AC | PRN
  Filled 2024-03-20: qty 14, 5d supply, fill #0

## 2024-03-20 MED ORDER — OXYCODONE-ACETAMINOPHEN 7.5-325 MG PO TABS
1.0000 | ORAL_TABLET | Freq: Four times a day (QID) | ORAL | 0 refills | Status: AC | PRN
  Filled 2024-03-20: qty 10, 3d supply, fill #0

## 2024-03-20 MED ORDER — PREDNISONE 20 MG PO TABS: ORAL_TABLET | ORAL | 0 refills | Status: AC

## 2024-03-20 MED ORDER — POLYETHYLENE GLYCOL 3350 17 GM/SCOOP PO POWD
17.0000 g | Freq: Every day | ORAL | 0 refills | Status: AC | PRN
Start: 2024-03-20 — End: ?
  Filled 2024-03-20: qty 238, 14d supply, fill #0

## 2024-03-20 NOTE — Plan of Care (Signed)
  Problem: Activity: Goal: Risk for activity intolerance will decrease Outcome: Progressing   Problem: Nutrition: Goal: Adequate nutrition will be maintained Outcome: Progressing   Problem: Coping: Goal: Level of anxiety will decrease Outcome: Progressing   Problem: Pain Managment: Goal: General experience of comfort will improve and/or be controlled Outcome: Progressing   Problem: Safety: Goal: Ability to remain free from injury will improve Outcome: Progressing

## 2024-03-20 NOTE — Discharge Summary (Signed)
 Physician Discharge Summary   Patient: [name] [name] MRN: [id number] DOB: 03/16/1961  Admit date:     03/18/2024  Discharge date: 03/20/24  Discharge Physician: Loyce Dys   PCP: Center, [facility]   Recommendations at discharge:  Follow-up with physical therapy  Discharge Diagnoses:  Acute on chronic back pain Likely secondary to radiculitis SIRS (systemic inflammatory response syndrome) [facility]) AKI-improved   Hospital Course: "[name] [name] is a 63 y.o. male with medical history significant of sciatica who presents to the ED due to back pain.  Mr. Kazee states that on the day before presentation, he was climbing out of his semitruck when he missed the last step and started to slip down.  He was able to catch himself using his right arm.  He denies any head trauma, loss of consciousness.  He denies any prodromal symptoms.  Afterwards, he was having significant right arm, right shoulder, right leg soreness with right lower back pain.  He is having numbness and tingling that radiates on the lateral aspect of his right thigh down to his right foot.  He states that is not quite the same as his past sciatica as it seems much worse.  He denies any focal weakness. MRI of the spine showed findings of multilevel canal and foraminal stenosis with degenerative changes.  Neurosurgeon was consulted who recommended short-term course of steroid therapy and to have outpatient follow-up with physical therapy.  Patient's back pain and weakness improved significantly with steroid therapy and therefore being discharged today with steroid taper and pain medication.    Consultants: Neurosurgery Procedures performed: none  Disposition: Home Diet recommendation:  Cardiac diet DISCHARGE MEDICATION: Allergies as of 03/20/2024   No Known Allergies      Medication List     STOP taking these medications    sulfamethoxazole-trimethoprim 800-160 MG tablet Commonly known as: Septra DS   tiZANidine  4 MG tablet Commonly known as: Zanaflex       TAKE these medications    acetaminophen 325 MG tablet Commonly known as: TYLENOL Take 2 tablets (650 mg total) by mouth every 6 (six) hours as needed for mild pain (pain score 1-3) (or Fever >/= 101).   cyclobenzaprine 10 MG tablet Commonly known as: FLEXERIL Take 1 tablet (10 mg total) by mouth 2 (two) times daily as needed for muscle spasms.   diphenhydrAMINE 25 MG tablet Commonly known as: Benadryl Take 1 tablet (25 mg total) by mouth every 6 (six) hours as needed for itching or allergies (Rash).   ibuprofen 800 MG tablet Commonly known as: ADVIL Take 1 tablet (800 mg total) by mouth every 8 (eight) hours as needed for moderate pain (pain score 4-6).   oxyCODONE-acetaminophen 7.5-325 MG tablet Commonly known as: PERCOCET Take 1 tablet by mouth every 6 (six) hours as needed for severe pain (pain score 7-10).   polyethylene glycol powder 17 GM/SCOOP powder Commonly known as: GLYCOLAX/MIRALAX Take 17 g by mouth daily as needed for mild constipation.   predniSONE 20 MG tablet Commonly known as: DELTASONE Take 2 tablets (40 mg total) by mouth 2 (two) times daily for 3 days, THEN 2 tablets (40 mg total) daily for 3 days, THEN 1 tablet (20 mg total) daily for 3 days, THEN 0.5 tablets (10 mg total) daily for 3 days. Take 40 mg by mouth daily for 3 days, then 20mg  by mouth daily for 3 days, then 10mg  daily for 3 days. Start taking on: March 20, 2024 What changed: See  the new instructions.        Follow-up Information     Center, [facility]. Go on 03/28/2024.   Why: @8 :15am Contact information: [address] [place] [address] [phone]         [facility] Follow up.   Why: Facility will call the patient to schedule an appointment.               Discharge Exam: Filed Weights   03/18/24 0832  Weight: 108.9 kg   Mouth: Mucous membranes are moist.     Pharynx:  Oropharynx is clear.  Eyes:     Conjunctiva/sclera: Conjunctivae normal.     Pupils: Pupils are equal, round, and reactive to light.  Cardiovascular:     Rate and Rhythm: Normal rate and regular rhythm.     Heart sounds: No murmur heard.    No gallop.  Pulmonary:     Effort: Pulmonary effort is normal. No respiratory distress.     Breath sounds: Normal breath sounds. No wheezing, rhonchi or rales.  Abdominal:     General: Bowel sounds are normal. There is no distension.     Palpations: Abdomen is soft.     Tenderness: There is no abdominal tenderness. There is no guarding.  Musculoskeletal:     Right lower leg: No edema.     Left lower leg: No edema.  Skin:    General: Skin is warm and dry.     Findings: No rash.  Neurological: Lower extremity weakness improved Psychiatric:      Condition at discharge: good  The results of significant diagnostics from this hospitalization (including imaging, microbiology, ancillary and laboratory) are listed below for reference.   Imaging Studies: MR THORACIC SPINE W WO CONTRAST Result Date: 03/18/2024 CLINICAL DATA:  Back pain EXAM: MRI THORACIC WITHOUT AND WITH CONTRAST TECHNIQUE: Multiplanar and multiecho pulse sequences of the thoracic spine were obtained without and with intravenous contrast. CONTRAST:  10mL GADAVIST GADOBUTROL 1 MMOL/ML IV SOLN COMPARISON:  None Available. FINDINGS: Alignment:  Physiologic. Vertebrae: Vertebral body heights are maintained without fracture. No evidence of discitis. Generalized decreased T1 and T2 bone marrow signal throughout the thoracic spine. No discrete marrow replacing bone lesion. No abnormal postcontrast enhancement. Cord:  Normal signal and morphology. Paraspinal and other soft tissues: Negative. Disc levels: There are a few shallow noncompressive disc bulges, notably at T9-10. Facet hypertrophy is present within the upper thoracic and lower thoracic spine. Mild foraminal stenosis on the right at T1-T2.  Mild bilateral foraminal stenosis at T2-T3. No additional sites of foraminal narrowing. No canal stenosis at any level. IMPRESSION: 1. Mild degenerative changes of the thoracic spine. Mild foraminal stenosis on the right at T1-T2 and bilaterally at T2-T3. No canal stenosis at any level. 2. Generalized decreased T1 and T2 bone marrow signal throughout the thoracic spine. This is nonspecific and can be seen in the setting of chronic anemia, smoking, and/or obesity. Electronically Signed   By: Duanne Guess D.O.   On: 03/18/2024 14:13   MR Cervical Spine W and Wo Contrast Result Date: 03/18/2024 CLINICAL DATA:  Right arm pain after fall EXAM: MRI CERVICAL SPINE WITHOUT AND WITH CONTRAST TECHNIQUE: Multiplanar and multiecho pulse sequences of the cervical spine, to include the craniocervical junction and cervicothoracic junction, were obtained without and with intravenous contrast. CONTRAST:  10mL GADAVIST GADOBUTROL 1 MMOL/ML IV SOLN COMPARISON:  None Available. FINDINGS: Alignment: Straightening and reversal of the cervical lordosis. No traumatic listhesis. Vertebrae: No acute  fracture. No evidence of discitis. Mild diffuse intrinsic canal narrowing on the basis of congenitally short pedicles. Generalized decreased T1 and T2 bone marrow signal throughout the cervical spine. No discrete marrow replacing bone lesion. No abnormal postcontrast enhancement. Cord: Normal signal and morphology. Posterior Fossa, vertebral arteries, paraspinal tissues: Negative. Disc levels: C2-C3: No disc protrusion. Mild facet hypertrophy. No foraminal or canal stenosis. C3-C4: Disc bulge with left greater than right facet and uncovertebral arthropathy. Severe left and mild-moderate right foraminal stenosis. Mild canal stenosis. C4-C5: Disc osteophyte complex with right greater than left facet and uncovertebral arthropathy. Moderate canal stenosis with moderate bilateral foraminal stenosis. C5-C6: Disc osteophyte complex with bilateral  facet and uncovertebral arthropathy. Moderate-severe canal stenosis with moderate-severe bilateral foraminal stenosis. C6-C7: Disc osteophyte complex with bilateral facet and uncovertebral arthropathy. Moderate canal stenosis with severe left and moderate-severe right foraminal stenosis. C7-T1: Mild facet hypertrophy without foraminal or canal stenosis. IMPRESSION: 1. No acute osseous abnormality of the cervical spine. 2. 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 Findings are most pronounced at the C5-6 level where there is moderate-severe canal stenosis and moderate-severe bilateral foraminal stenosis. 3. Moderate canal stenosis at C4-5 and C6-7. 4. Severe left and moderate-severe right foraminal stenosis at C6-7. 5. Generalized decreased T1 and T2 bone marrow signal throughout the cervical spine. This is nonspecific and can be seen in the setting of chronic anemia, smoking, and/or obesity. Electronically Signed   By: Duanne Guess D.O.   On: 03/18/2024 14:00   MR Lumbar Spine W Wo Contrast Result Date: 03/18/2024 CLINICAL DATA:  Back pain EXAM: MRI LUMBAR SPINE WITHOUT AND WITH CONTRAST TECHNIQUE: Multiplanar and multiecho pulse sequences of the lumbar spine were obtained without and with intravenous contrast. CONTRAST:  10mL GADAVIST GADOBUTROL 1 MMOL/ML IV SOLN COMPARISON:  03/11/2020 FINDINGS: Segmentation:  Standard. Alignment:  Physiologic. Vertebrae: No acute fracture. No evidence of discitis. Generalized decreased T1 and T2 bone marrow signal throughout the included osseous structures is unchanged in appearance compared to the prior study. No discrete marrow replacing bone lesion. No abnormal postcontrast enhancement. Conus medullaris and cauda equina: Conus extends to the L1 level. Conus and cauda equina appear normal. Paraspinal and other soft tissues: Negative. Disc levels: T12-L1: No significant disc protrusion, foraminal stenosis, or canal stenosis. L1-L2: No  significant disc protrusion, foraminal stenosis, or canal stenosis. L2-L3: Minimal annular disc bulge and mild bilateral facet arthropathy. No canal stenosis. Mild bilateral foraminal stenosis. Unchanged. L3-L4: Annular disc bulge with bilateral facet arthropathy. Bilateral subarticular recess stenosis with mild canal stenosis. Moderate bilateral foraminal stenosis. No significant interval progression. L4-L5: Annular disc bulge with bilateral facet arthropathy. Mild-to-moderate canal stenosis with bilateral subarticular recess stenosis. Moderate-severe right and mild-moderate left foraminal stenosis. Mild interval progression. L5-S1: No disc protrusion. Moderate facet arthropathy. No canal stenosis. Mild bilateral foraminal stenosis. No significant interval progression. IMPRESSION: 1. Multilevel lumbar spondylosis, slightly progressed at L4-L5 where there is mild-to-moderate canal stenosis with bilateral subarticular recess stenosis and moderate-severe right and mild-moderate left foraminal stenosis. 2. Mild canal stenosis and moderate bilateral foraminal stenosis at L3-L4. 3. Generalized decreased T1 and T2 bone marrow signal throughout the included osseous structures is unchanged in appearance compared to the prior study. No discrete marrow replacing bone lesion. Findings are nonspecific and can be seen in the setting of chronic anemia, smoking, and/or obesity. Electronically Signed   By: Duanne Guess D.O.   On: 03/18/2024 13:56   CT Angio Chest/Abd/Pel for Dissection W and/or W/WO Result Date: 03/18/2024  CLINICAL DATA:  Back and right arm pain. Nausea, dizziness, and lightheadedness. EXAM: CT ANGIOGRAPHY CHEST, ABDOMEN AND PELVIS TECHNIQUE: Non-contrast CT of the chest was initially obtained. Multidetector CT imaging through the chest, abdomen and pelvis was performed using the standard protocol during bolus administration of intravenous contrast. Multiplanar reconstructed images and MIPs were obtained and  reviewed to evaluate the vascular anatomy. RADIATION DOSE REDUCTION: This exam was performed according to the departmental dose-optimization program which includes automated exposure control, adjustment of the mA and/or kV according to patient size and/or use of iterative reconstruction technique. CONTRAST:  OMNIPAQUE IOHEXOL 350 MG/ML SOLN COMPARISON:  Chest x-ray from same day. FINDINGS: CTA CHEST FINDINGS Cardiovascular: No evidence thoracic intramural hematoma on noncontrast images. No evidence of thoracic aortic aneurysm or dissection. Normal heart size. No pericardial effusion. No pulmonary embolism. Mediastinum/Nodes: No enlarged mediastinal, hilar, or axillary lymph nodes. Thyroid gland, trachea, and esophagus demonstrate no significant findings. Lungs/Pleura: There are a few tiny ground-glass centrilobular nodules in the left lower lobe. No focal consolidation, pleural effusion, or pneumothorax. Musculoskeletal: No chest wall abnormality. No acute or significant osseous findings. Review of the MIP images confirms the above findings. CTA ABDOMEN AND PELVIS FINDINGS VASCULAR Aorta: Normal caliber aorta without aneurysm, dissection, vasculitis or significant stenosis. Celiac: Patent without evidence of aneurysm, dissection, vasculitis or significant stenosis. SMA: Patent without evidence of aneurysm, dissection, vasculitis or significant stenosis. Renals: Both renal arteries are patent without evidence of aneurysm, dissection, vasculitis, fibromuscular dysplasia or significant stenosis. IMA: Patent without evidence of aneurysm, dissection, vasculitis or significant stenosis. Inflow: Patent without evidence of aneurysm, dissection, vasculitis or significant stenosis. Veins: No obvious venous abnormality within the limitations of this arterial phase study. Review of the MIP images confirms the above findings. NON-VASCULAR Hepatobiliary: No focal liver abnormality is seen. No gallstones, gallbladder wall  thickening, or biliary dilatation. Pancreas: Unremarkable. No pancreatic ductal dilatation or surrounding inflammatory changes. Spleen: Normal in size without focal abnormality. Adrenals/Urinary Tract: Adrenal glands are unremarkable. Kidneys are normal, without renal calculi, focal lesion, or hydronephrosis. Bladder is unremarkable. Stomach/Bowel: Stomach is within normal limits. Appendix appears normal. No evidence of bowel wall thickening, distention, or inflammatory changes. Lymphatic: No enlarged abdominal or pelvic lymph nodes. Reproductive: Borderline prostatomegaly. Other: No abdominal wall hernia or abnormality. No abdominopelvic ascites. No pneumoperitoneum. Musculoskeletal: No acute or significant osseous findings. Review of the MIP images confirms the above findings. IMPRESSION: 1. No evidence of acute aortic syndrome or aneurysm. No acute intrathoracic or intra-abdominal process. 2. Few tiny ground-glass centrilobular nodules in the left lower lobe, likely infectious or inflammatory. Electronically Signed   By: Obie Dredge M.D.   On: 03/18/2024 10:41   DG Shoulder Right Result Date: 03/18/2024 CLINICAL DATA:  Right shoulder and back pain following minor injury. EXAM: RIGHT SHOULDER - 2+ VIEW COMPARISON:  None Available. FINDINGS: The mineralization and alignment are normal. There is no evidence of acute fracture or dislocation. Mild acromioclavicular and glenohumeral degenerative changes. The subacromial space is preserved. No focal soft tissue abnormalities are identified. IMPRESSION: No evidence of acute fracture or dislocation. Mild degenerative changes. Electronically Signed   By: Carey Bullocks M.D.   On: 03/18/2024 09:29   DG Chest Portable 1 View Result Date: 03/18/2024 CLINICAL DATA:  Hypoxia.  Right shoulder pain.  Status post fall EXAM: PORTABLE CHEST 1 VIEW COMPARISON:  01/08/2015 FINDINGS: Heart size and mediastinal contours appear normal. No pleural fluid, interstitial edema or  airspace disease. Visualized osseous structures appear intact. IMPRESSION: No  active disease. Electronically Signed   By: Signa Kell M.D.   On: 03/18/2024 09:28    Microbiology: Results for orders placed or performed during the hospital encounter of 03/18/24  Culture, blood (routine x 2)     Status: None (Preliminary result)   Collection Time: 03/18/24  8:45 AM   Specimen: BLOOD  Result Value Ref Range Status   Specimen Description BLOOD RIGHT ANTECUBITAL  Final   Special Requests   Final    BOTTLES DRAWN AEROBIC AND ANAEROBIC Blood Culture results may not be optimal due to an inadequate volume of blood received in culture bottles   Culture   Final    NO GROWTH 2 DAYS Performed at [facility] [address], [place], [place] [address]    Report Status PENDING  Incomplete  Culture, blood (routine x 2)     Status: None (Preliminary result)   Collection Time: 03/18/24  8:45 AM   Specimen: BLOOD  Result Value Ref Range Status   Specimen Description BLOOD BLOOD RIGHT HAND  Final   Special Requests   Final    BOTTLES DRAWN AEROBIC AND ANAEROBIC Blood Culture results may not be optimal due to an inadequate volume of blood received in culture bottles   Culture   Final    NO GROWTH 2 DAYS Performed at [facility], [address] [place], [place] [address]    Report Status PENDING  Incomplete  Resp panel by RT-PCR (RSV, Flu A&B, Covid) Anterior Nasal Swab     Status: None   Collection Time: 03/18/24  8:45 AM   Specimen: Anterior Nasal Swab  Result Value Ref Range Status   SARS Coronavirus 2 by RT PCR NEGATIVE NEGATIVE Final    Comment: (NOTE) SARS-CoV-2 target nucleic acids are NOT DETECTED.  The SARS-CoV-2 RNA is generally detectable in upper respiratory specimens during the acute phase of infection. The lowest concentration of SARS-CoV-2 viral copies this assay can detect is 138 copies/mL. A negative result does not preclude SARS-Cov-2 infection and should  not be used as the sole basis for treatment or other patient management decisions. A negative result may occur with  improper specimen collection/handling, submission of specimen other than nasopharyngeal swab, presence of viral mutation(s) within the areas targeted by this assay, and inadequate number of viral copies(<138 copies/mL). A negative result must be combined with clinical observations, patient history, and epidemiological information. The expected result is Negative.  Fact Sheet for Patients:  BloggerCourse.com  Fact Sheet for Healthcare Providers:  SeriousBroker.it  This test is no t yet approved or cleared by the [place] FDA and  has been authorized for detection and/or diagnosis of SARS-CoV-2 by FDA under an Emergency Use Authorization (EUA). This EUA will remain  in effect (meaning this test can be used) for the duration of the COVID-19 declaration under Section 564(b)(1) of the Act, 21 U.S.C.section 360bbb-3(b)(1), unless the authorization is terminated  or revoked sooner.       Influenza A by PCR NEGATIVE NEGATIVE Final   Influenza B by PCR NEGATIVE NEGATIVE Final    Comment: (NOTE) The Xpert Xpress SARS-CoV-2/FLU/RSV plus assay is intended as an aid in the diagnosis of influenza from Nasopharyngeal swab specimens and should not be used as a sole basis for treatment. Nasal washings and aspirates are unacceptable for Xpert Xpress SARS-CoV-2/FLU/RSV testing.  Fact Sheet for Patients: BloggerCourse.com  Fact Sheet for Healthcare Providers: SeriousBroker.it  This test is not yet approved or cleared by the [place]  FDA and has been authorized for detection and/or diagnosis of SARS-CoV-2 by FDA under an Emergency Use Authorization (EUA). This EUA will remain in effect (meaning this test can be used) for the duration of the COVID-19 declaration under  Section 564(b)(1) of the Act, 21 U.S.C. section 360bbb-3(b)(1), unless the authorization is terminated or revoked.     Resp Syncytial Virus by PCR NEGATIVE NEGATIVE Final    Comment: (NOTE) Fact Sheet for Patients: BloggerCourse.com  Fact Sheet for Healthcare Providers: SeriousBroker.it  This test is not yet approved or cleared by the [place] FDA and has been authorized for detection and/or diagnosis of SARS-CoV-2 by FDA under an Emergency Use Authorization (EUA). This EUA will remain in effect (meaning this test can be used) for the duration of the COVID-19 declaration under Section 564(b)(1) of the Act, 21 U.S.C. section 360bbb-3(b)(1), unless the authorization is terminated or revoked.  Performed at [facility], [address] [place], [place] [address]   Respiratory (~20 pathogens) panel by PCR     Status: None   Collection Time: 03/18/24  6:12 PM   Specimen: Nasopharyngeal Swab; Respiratory  Result Value Ref Range Status   Adenovirus NOT DETECTED NOT DETECTED Final   Coronavirus 229E NOT DETECTED NOT DETECTED Final    Comment: (NOTE) The Coronavirus on the Respiratory Panel, DOES NOT test for the novel  Coronavirus (2019 nCoV)    Coronavirus HKU1 NOT DETECTED NOT DETECTED Final   Coronavirus NL63 NOT DETECTED NOT DETECTED Final   Coronavirus OC43 NOT DETECTED NOT DETECTED Final   Metapneumovirus NOT DETECTED NOT DETECTED Final   Rhinovirus / Enterovirus NOT DETECTED NOT DETECTED Final   Influenza A NOT DETECTED NOT DETECTED Final   Influenza B NOT DETECTED NOT DETECTED Final   Parainfluenza Virus 1 NOT DETECTED NOT DETECTED Final   Parainfluenza Virus 2 NOT DETECTED NOT DETECTED Final   Parainfluenza Virus 3 NOT DETECTED NOT DETECTED Final   Parainfluenza Virus 4 NOT DETECTED NOT DETECTED Final   Respiratory Syncytial Virus NOT DETECTED NOT DETECTED Final   Bordetella pertussis NOT DETECTED NOT  DETECTED Final   Bordetella Parapertussis NOT DETECTED NOT DETECTED Final   Chlamydophila pneumoniae NOT DETECTED NOT DETECTED Final   Mycoplasma pneumoniae NOT DETECTED NOT DETECTED Final    Comment: Performed at [facility], [address], [place], [place] [address]    Labs: CBC: Recent Labs  Lab 03/18/24 0845 03/19/24 0636  WBC 12.2* 5.8  NEUTROABS 11.7*  --   HGB 12.7* 13.1  HCT 37.7* 39.0  MCV 91.5 90.7  PLT 124* 102*   Basic Metabolic Panel: Recent Labs  Lab 03/18/24 0845 03/19/24 0636  NA 136 138  K 4.0 3.7  CL 104 107  CO2 25 24  GLUCOSE 132* 109*  BUN 26* 17  CREATININE 1.33* 1.09  CALCIUM 8.1* 8.1*   Liver Function Tests: Recent Labs  Lab 03/18/24 0845  AST 27  ALT 16  ALKPHOS 45  BILITOT 1.4*  PROT 6.3*  ALBUMIN 3.0*   CBG: No results for input(s): "GLUCAP" in the last 168 hours.  Discharge time spent:  .  Signed: Loyce Dys, MD [facility] 03/20/2024

## 2024-03-23 LAB — CULTURE, BLOOD (ROUTINE X 2)
Culture: NO GROWTH
Culture: NO GROWTH

## 2024-08-03 ENCOUNTER — Other Ambulatory Visit (HOSPITAL_BASED_OUTPATIENT_CLINIC_OR_DEPARTMENT_OTHER): Payer: Self-pay
